# Patient Record
Sex: Female | Born: 1995 | Hispanic: Yes | Marital: Single | Smoking: Never smoker
Health system: Southern US, Community
[De-identification: ages and names within clinical notes are randomized; demographics above are authoritative.]

## PROBLEM LIST (undated history)

## (undated) ENCOUNTER — Inpatient Hospital Stay (HOSPITAL_COMMUNITY): Payer: Self-pay

## (undated) ENCOUNTER — Inpatient Hospital Stay: Payer: Self-pay

## (undated) DIAGNOSIS — B009 Herpesviral infection, unspecified: Secondary | ICD-10-CM

## (undated) DIAGNOSIS — Z789 Other specified health status: Secondary | ICD-10-CM

## (undated) HISTORY — PX: EXCISION VAGINAL CYST: SHX5825

## (undated) HISTORY — DX: Other specified health status: Z78.9

---
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
## 2014-04-05 ENCOUNTER — Emergency Department: Payer: Self-pay | Admitting: Emergency Medicine

## 2014-10-29 ENCOUNTER — Encounter (HOSPITAL_COMMUNITY): Payer: Self-pay | Admitting: *Deleted

## 2014-10-29 ENCOUNTER — Emergency Department (HOSPITAL_COMMUNITY)
Admission: EM | Admit: 2014-10-29 | Discharge: 2014-10-30 | Disposition: A | Discharge status: Home / Self Care | Payer: Self-pay | Attending: Emergency Medicine | Admitting: Emergency Medicine

## 2014-10-29 ENCOUNTER — Emergency Department (HOSPITAL_COMMUNITY): Payer: Self-pay

## 2014-10-29 DIAGNOSIS — Z3202 Encounter for pregnancy test, result negative: Secondary | ICD-10-CM | POA: Insufficient documentation

## 2014-10-29 DIAGNOSIS — N898 Other specified noninflammatory disorders of vagina: Secondary | ICD-10-CM | POA: Insufficient documentation

## 2014-10-29 DIAGNOSIS — R110 Nausea: Secondary | ICD-10-CM | POA: Insufficient documentation

## 2014-10-29 DIAGNOSIS — R1032 Left lower quadrant pain: Secondary | ICD-10-CM | POA: Insufficient documentation

## 2014-10-29 DIAGNOSIS — R102 Pelvic and perineal pain: Secondary | ICD-10-CM | POA: Insufficient documentation

## 2014-10-29 DIAGNOSIS — R7989 Other specified abnormal findings of blood chemistry: Secondary | ICD-10-CM | POA: Insufficient documentation

## 2014-10-29 LAB — URINALYSIS, ROUTINE W REFLEX MICROSCOPIC
BILIRUBIN URINE: NEGATIVE
Glucose, UA: NEGATIVE mg/dL
HGB URINE DIPSTICK: NEGATIVE
KETONES UR: NEGATIVE mg/dL
LEUKOCYTES UA: NEGATIVE
Nitrite: NEGATIVE
PH: 6 (ref 5.0–8.0)
Protein, ur: NEGATIVE mg/dL
Specific Gravity, Urine: 1.022 (ref 1.005–1.030)
Urobilinogen, UA: 0.2 mg/dL (ref 0.0–1.0)

## 2014-10-29 LAB — POC URINE PREG, ED: Preg Test, Ur: NEGATIVE

## 2014-10-29 NOTE — ED Notes (Signed)
?   Std exposure. Have some abd. Pain with intercourse.

## 2014-10-29 NOTE — ED Provider Notes (Signed)
CSN: [id number]     Arrival date & time 10/29/14  1847 History  This chart was scribed for non-physician practitioner working with Derwood KaplanAnkit Nanavati, MD by Elveria Risingimelie Horne, ED Scribe. This patient was seen in room B19C/B19C and the patient's care was started at 9:54 PM.   Chief Complaint  Patient presents with  . Exposure to STD   The history is provided by the patient. No language interpreter was used.   HPI Comments: [name] Richards is a 18 y.o. female with past medical history vaginal cyst presenting to the ED with vaginal discharge that has been ongoing for the past 4 days. Patient reports that the vaginal discharge has a clear discharge with a fishy odor. Stated that she's been experiencing vaginal itching. Reported that she has been experiencing vaginal discomfort worse with intercourse over the past couple of days. Stated that she started to experience left lower quadrant pain described as an intermittent burning sensation. Associated symptoms of nausea. Reported that she is not on any birth control. Reported that her last menstrual cycle was 10/01/2014. Stated that she is sexually active without using protection with her boyfriend. Denied dysuria, hematuria, vomiting, diarrhea, melena, hematochezia, neck pain, neck stiffness, back pain, fever, chills. [place] [facility]  History reviewed. No pertinent past medical history. Past Surgical History  Procedure Laterality Date  . Excision vaginal cyst     History reviewed. No pertinent family history. History  Substance Use Topics  . Smoking status: Never Smoker   . Smokeless tobacco: Not on file  . Alcohol Use: No   OB History    No data available     Review of Systems  Constitutional: Positive for chills. Negative for fever.  Respiratory: Negative for shortness of breath.   Cardiovascular: Negative for chest pain.  Gastrointestinal: Positive for abdominal pain.  Genitourinary: Positive for vaginal discharge, vaginal pain and  pelvic pain. Negative for dysuria, hematuria and vaginal bleeding.  Musculoskeletal: Negative for back pain, neck pain and neck stiffness.    Allergies  Review of patient's allergies indicates no known allergies.  Home Medications   Prior to Admission medications   Not on File   Triage Vitals: BP 124/71 mmHg  Pulse 116  Temp(Src) 98.3 F (36.8 C) (Oral)  Resp 14  Ht 5\' 1"  (1.549 m)  Wt 100 lb (45.36 kg)  BMI 18.90 kg/m2  SpO2 100%  LMP 10/01/2014   Physical Exam  Constitutional: She is oriented to person, place, and time. She appears well-developed and well-nourished. No distress.  HENT:  Head: Normocephalic and atraumatic.  Mouth/Throat: Oropharynx is clear and moist. No oropharyngeal exudate.  Eyes: Conjunctivae and EOM are normal. Pupils are equal, round, and reactive to light. Right eye exhibits no discharge. Left eye exhibits no discharge.  Neck: Normal range of motion. Neck supple. No tracheal deviation present.  Negative neck stiffness Negative nuchal rigidity Negative cervical lymphadenopathy Negative meningeal signs  Cardiovascular: Normal rate, regular rhythm and normal heart sounds.  Exam reveals no friction rub.   No murmur heard. Pulmonary/Chest: Effort normal and breath sounds normal. No respiratory distress. She has no wheezes. She has no rales.  Abdominal: Soft. Bowel sounds are normal. She exhibits no distension. There is tenderness. There is no rebound and no guarding.  Negative abdominal distention Bowel sounds normal active in all 4 quadrants Abdomen soft upon palpation Mild discomfort upon palpation to left lower quadrant Negative guarding Negative peritoneal signs   Genitourinary:  Pelvic exam: Negative swelling, erythema, inflammation,  lesions, sores, deformities identified the external genitalia and vaginal canal. Negative blood in the vaginal vault. Negative masses or polyps identified. Service identified negative friability. Thick white  discharge noted with negative odor. Negative CMT. Mild left adnexal tenderness noted upon palpation. Negative right adnexal tenderness. Exam chaperoned with tech  Musculoskeletal: Normal range of motion.  Lymphadenopathy:    She has no cervical adenopathy.  Neurological: She is alert and oriented to person, place, and time. No cranial nerve deficit. She exhibits normal muscle tone. Coordination normal.  Skin: Skin is warm and dry.  Psychiatric: She has a normal mood and affect. Her behavior is normal.  Nursing note and vitals reviewed.   ED Course  Procedures (including critical care time)  COORDINATION OF CARE: 9:54 PM- Discussed treatment plan with patient at bedside and patient agreed to plan.   Results for orders placed or performed during the hospital encounter of 10/29/14  Wet prep, genital  Result Value Ref Range   Yeast Wet Prep HPF POC FEW (A) NONE SEEN   Trich, Wet Prep NONE SEEN NONE SEEN   Clue Cells Wet Prep HPF POC FEW (A) NONE SEEN   WBC, Wet Prep HPF POC NONE SEEN NONE SEEN  Urinalysis, Routine w reflex microscopic  Result Value Ref Range   Color, Urine YELLOW YELLOW   APPearance CLOUDY (A) CLEAR   Specific Gravity, Urine 1.022 1.005 - 1.030   pH 6.0 5.0 - 8.0   Glucose, UA NEGATIVE NEGATIVE mg/dL   Hgb urine dipstick NEGATIVE NEGATIVE   Bilirubin Urine NEGATIVE NEGATIVE   Ketones, ur NEGATIVE NEGATIVE mg/dL   Protein, ur NEGATIVE NEGATIVE mg/dL   Urobilinogen, UA 0.2 0.0 - 1.0 mg/dL   Nitrite NEGATIVE NEGATIVE   Leukocytes, UA NEGATIVE NEGATIVE  RPR  Result Value Ref Range   RPR NON REAC NON REAC  CBC  Result Value Ref Range   WBC 7.4 4.0 - 10.5 K/uL   RBC 4.17 3.87 - 5.11 MIL/uL   Hemoglobin 12.4 12.0 - 15.0 g/dL   HCT 25.3 66.4 - 40.3 %   MCV 89.0 78.0 - 100.0 fL   MCH 29.7 26.0 - 34.0 pg   MCHC 33.4 30.0 - 36.0 g/dL   RDW 47.4 25.9 - 56.3 %   Platelets 211 150 - 400 K/uL  Basic metabolic panel  Result Value Ref Range   Sodium 141 137 - 147  mEq/L   Potassium 3.8 3.7 - 5.3 mEq/L   Chloride 107 96 - 112 mEq/L   CO2 20 19 - 32 mEq/L   Glucose, Bld 99 70 - 99 mg/dL   BUN 7 6 - 23 mg/dL   Creatinine, Ser 8.75 0.50 - 1.10 mg/dL   Calcium 9.0 8.4 - 64.3 mg/dL   GFR calc non Af Amer >90 >90 mL/min   GFR calc Af Amer >90 >90 mL/min   Anion gap 14 5 - 15  Troponin I  Result Value Ref Range   Troponin I <0.30 <0.30 ng/mL  D-dimer, quantitative  Result Value Ref Range   D-Dimer, Quant 0.83 (H) 0.00 - 0.48 ug/mL-FEU  POC Urine Pregnancy, ED (do NOT order at [facility])  Result Value Ref Range   Preg Test, Ur NEGATIVE NEGATIVE    Labs Review Labs Reviewed  WET PREP, GENITAL - Abnormal; Notable for the following:    Yeast Wet Prep HPF POC FEW (*)    Clue Cells Wet Prep HPF POC FEW (*)    All other components within normal limits  URINALYSIS, ROUTINE W REFLEX MICROSCOPIC - Abnormal; Notable for the following:    APPearance CLOUDY (*)    All other components within normal limits  D-DIMER, QUANTITATIVE - Abnormal; Notable for the following:    D-Dimer, Quant 0.83 (*)    All other components within normal limits  GC/CHLAMYDIA PROBE AMP  RPR  CBC  BASIC METABOLIC PANEL  TROPONIN I  HIV ANTIBODY (ROUTINE TESTING)  HSV 1 ANTIBODY, IGG  HSV 2 ANTIBODY, IGG  POC URINE PREG, ED    Imaging Review US Transvaginal Non-ob  10/30/2014   CLINICAL DATA:  Left lower quadrant pain.  EXAM: TRANSABDOMINAL AND TRANSVAGINAL ULTRASOUND OF PELVIS  DOPPLER ULTRASOUND OF OVARIES  TECHNIQUE: Both transabdominal and transvaginal ultrasound examinations of the pelvis were performed. Transabdominal technique was performed for global imaging of the pelvis including uterus, ovaries, adnexal regions, and pelvic cul-de-sac.  It was necessary to proceed with endovaginal exam following the transabdominal exam to visualize the uterus and ovaries. Color and duplex Doppler ultrasound was utilized to evaluate blood flow to the ovaries.  COMPARISON:  None.  FINDINGS:  Uterus  Measurements: 7.1 x 3.2 x 4.8 cm, anteverted. No fibroids or other mass visualized.  Endometrium  Thickness: 11 mm.  No focal abnormality visualized.  Right ovary  Measurements: 3.9 x 1.4 x 2.4 cm. Normal appearance/no adnexal mass.  Left ovary  Measurements: 3.7 x 2 x 2.7 cm. Normal appearance/no adnexal mass.  Pulsed Doppler evaluation of both ovaries demonstrates normal low-resistance arterial and venous waveforms. Flow is demonstrated in both ovaries on color flow Doppler imaging.  Other findings  No free fluid.  IMPRESSION: Normal ultrasound appearance of the uterus and ovaries. No evidence of ovarian mass or torsion.   Electronically Signed   By: Burman Nieves M.D.   On: 10/30/2014 00:51   US Pelvis Complete  10/30/2014   CLINICAL DATA:  Left lower quadrant pain.  EXAM: TRANSABDOMINAL AND TRANSVAGINAL ULTRASOUND OF PELVIS  DOPPLER ULTRASOUND OF OVARIES  TECHNIQUE: Both transabdominal and transvaginal ultrasound examinations of the pelvis were performed. Transabdominal technique was performed for global imaging of the pelvis including uterus, ovaries, adnexal regions, and pelvic cul-de-sac.  It was necessary to proceed with endovaginal exam following the transabdominal exam to visualize the uterus and ovaries. Color and duplex Doppler ultrasound was utilized to evaluate blood flow to the ovaries.  COMPARISON:  None.  FINDINGS: Uterus  Measurements: 7.1 x 3.2 x 4.8 cm, anteverted. No fibroids or other mass visualized.  Endometrium  Thickness: 11 mm.  No focal abnormality visualized.  Right ovary  Measurements: 3.9 x 1.4 x 2.4 cm. Normal appearance/no adnexal mass.  Left ovary  Measurements: 3.7 x 2 x 2.7 cm. Normal appearance/no adnexal mass.  Pulsed Doppler evaluation of both ovaries demonstrates normal low-resistance arterial and venous waveforms. Flow is demonstrated in both ovaries on color flow Doppler imaging.  Other findings  No free fluid.  IMPRESSION: Normal ultrasound appearance of the  uterus and ovaries. No evidence of ovarian mass or torsion.   Electronically Signed   By: Burman Nieves M.D.   On: 10/30/2014 00:51   [place] Art/ven Flow Abd Pelv Doppler  10/30/2014   CLINICAL DATA:  Left lower quadrant pain.  EXAM: TRANSABDOMINAL AND TRANSVAGINAL ULTRASOUND OF PELVIS  DOPPLER ULTRASOUND OF OVARIES  TECHNIQUE: Both transabdominal and transvaginal ultrasound examinations of the pelvis were performed. Transabdominal technique was performed for global imaging of the pelvis including uterus, ovaries, adnexal regions, and pelvic cul-de-sac.  It was necessary  to proceed with endovaginal exam following the transabdominal exam to visualize the uterus and ovaries. Color and duplex Doppler ultrasound was utilized to evaluate blood flow to the ovaries.  COMPARISON:  None.  FINDINGS: Uterus  Measurements: 7.1 x 3.2 x 4.8 cm, anteverted. No fibroids or other mass visualized.  Endometrium  Thickness: 11 mm.  No focal abnormality visualized.  Right ovary  Measurements: 3.9 x 1.4 x 2.4 cm. Normal appearance/no adnexal mass.  Left ovary  Measurements: 3.7 x 2 x 2.7 cm. Normal appearance/no adnexal mass.  Pulsed Doppler evaluation of both ovaries demonstrates normal low-resistance arterial and venous waveforms. Flow is demonstrated in both ovaries on color flow Doppler imaging.  Other findings  No free fluid.  IMPRESSION: Normal ultrasound appearance of the uterus and ovaries. No evidence of ovarian mass or torsion.   Electronically Signed   By: Burman NievesWilliam  Stevens M.D.   On: 10/30/2014 00:51     EKG Interpretation None      1:36 AM This provider reassessed the patient. Discussed labs in great detail and imaging in great detail. Patient reported that she's been starting to experience chest pain localized to center of her chest with associated shortness of breath, beginning just a couple of minutes ago. Patient reports that she has discomfort in the center of her chest with associated shortness of  breath-reported that she feels better after taking a deep breath in and out. Stated that she feels mildly anxious. Heart rate tachycardic upon auscultation. Negative wheezing. Negative stridor. Negative angioedema. Patient able to speak in full sentences without difficulty.   3:28 AM Patient re-assessed by this provider. Patient continues to be mildly tachycardic at 112 bpm. Reported that she is still experiencing some chest tightness/discomfort in the center of her chest with shortness of breath. Reported that her throat closing sensation has resolved. When in discussion, patient reported that she had episodes of shortness of breath and chest pain intermittently this past week. When asked why patient denied chest pain and shortness of breath at the initial interview, patient reported that she didn't think anything of it.  Heart rate tachycardic. Pulses palpable and strong. Patient continues to speak in full sentences without difficulty - negative stridor noted. Negative wheezing. Tenderness upon palpation to the chest wall - mid-sternal region.    MDM   Final diagnoses:  Pelvic pain in female  Elevated d-dimer    Medications  cefTRIAXone (ROCEPHIN) injection 250 mg (250 mg Intramuscular Given 10/30/14 0122)  azithromycin (ZITHROMAX) tablet 1,000 mg (1,000 mg Oral Given 10/30/14 0122)  lidocaine (PF) (XYLOCAINE) 1 % injection 2 mL (2 mLs Other Given 10/30/14 0122)  sodium chloride 0.9 % bolus 1,000 mL (0 mLs Intravenous Stopped 10/30/14 0315)  sodium chloride 0.9 % bolus 1,000 mL (1,000 mLs Intravenous New Bag/Given 10/30/14 0340)    Filed Vitals:   10/30/14 0141 10/30/14 0240 10/30/14 0254 10/30/14 0340  BP: 123/67   92/62  Pulse: 112 113 109 98  Temp: 98.1 F (36.7 C)     TempSrc: Oral     Resp: 16   13  Height:      Weight:      SpO2: 99%   100%   I personally performed the services described in this documentation, which was scribed in my presence. The recorded information has been  reviewed and is accurate.  CBC negative elevated white blood cell count. Hemoglobin 12.4, hematocrit 37.1. BMP unremarkable. Glucose 99, negative elevated anion-14.0 mEq per liter. Urine pregnancy negative. Urinalysis negative  for hemoglobin, nitrites, leukocytes. Wet prep identified few yeast and few clue cells-negative findings to Trichomonas or white blood cell count. GC chlamydia probe, RPR, HIV, HSV 1 and 2 pending. Pelvic ultrasound unremarkable-no evidence of ovarian torsion or mass. Doubt TOA. Doubt ovarian torsion. Doubt ectopic pregnancy. Doubt molar pregnancy. Doubt UTI. Doubt pyelonephritis. Patient treated prophylactically for possible STD infection since patient is sexually active and does not use protection.  While in the ED setting, patient reported that she started to experience chest pain substernal and shortness of breath. Tachycardic at 120 bpm. EKG, labs ordered. EKG noted sinus tachycardia with a heart rate 111 bpm. Troponin negative elevation. D-dimer elevated at 0.83. CT angiogram of the chest ordered. Discussed plan of care with Dr. Theodoro Grist. Transfer of care to Dr. Theodoro Grist at change in shift.   Raymon Mutton, PA-C 10/30/14 0424  Flint Melter, MD 11/02/14 [phone]

## 2014-10-30 ENCOUNTER — Emergency Department (HOSPITAL_COMMUNITY): Payer: Self-pay

## 2014-10-30 ENCOUNTER — Encounter (HOSPITAL_COMMUNITY): Payer: Self-pay

## 2014-10-30 LAB — CBC
HCT: 37.1 % (ref 36.0–46.0)
Hemoglobin: 12.4 g/dL (ref 12.0–15.0)
MCH: 29.7 pg (ref 26.0–34.0)
MCHC: 33.4 g/dL (ref 30.0–36.0)
MCV: 89 fL (ref 78.0–100.0)
Platelets: 211 10*3/uL (ref 150–400)
RBC: 4.17 MIL/uL (ref 3.87–5.11)
RDW: 13.2 % (ref 11.5–15.5)
WBC: 7.4 10*3/uL (ref 4.0–10.5)

## 2014-10-30 LAB — HSV 1 ANTIBODY, IGG: HSV 1 Glycoprotein G Ab, IgG: 11.49 IV — ABNORMAL HIGH

## 2014-10-30 LAB — BASIC METABOLIC PANEL
Anion gap: 14 (ref 5–15)
BUN: 7 mg/dL (ref 6–23)
CHLORIDE: 107 meq/L (ref 96–112)
CO2: 20 meq/L (ref 19–32)
CREATININE: 0.58 mg/dL (ref 0.50–1.10)
Calcium: 9 mg/dL (ref 8.4–10.5)
GFR calc Af Amer: >90 mL/min (ref >90)
GFR calc non Af Amer: >90 mL/min (ref >90)
Glucose, Bld: 99 mg/dL (ref 70–99)
Potassium: 3.8 mEq/L (ref 3.7–5.3)
Sodium: 141 mEq/L (ref 137–147)

## 2014-10-30 LAB — RPR

## 2014-10-30 LAB — WET PREP, GENITAL
Trich, Wet Prep: NONE SEEN
WBC, Wet Prep HPF POC: NONE SEEN

## 2014-10-30 LAB — HSV 2 ANTIBODY, IGG: HSV 2 Glycoprotein G Ab, IgG: 1.56 IV — ABNORMAL HIGH

## 2014-10-30 LAB — D-DIMER, QUANTITATIVE: D-Dimer, Quant: 0.83 ug/mL-FEU — ABNORMAL HIGH (ref 0.00–0.48)

## 2014-10-30 LAB — HIV ANTIBODY (ROUTINE TESTING W REFLEX): HIV: NONREACTIVE

## 2014-10-30 LAB — TROPONIN I: Troponin I: <0.3 ng/mL (ref <0.30)

## 2014-10-30 MED ORDER — LIDOCAINE HCL (PF) 1 % IJ SOLN
2.0000 mL | Freq: Once | INTRAMUSCULAR | Status: AC
  Administered 2014-10-30: 2 mL
  Filled 2014-10-30: qty 5

## 2014-10-30 MED ORDER — CEFTRIAXONE SODIUM 250 MG IJ SOLR
250.0000 mg | Freq: Once | INTRAMUSCULAR | Status: AC
  Administered 2014-10-30: 250 mg via INTRAMUSCULAR
  Filled 2014-10-30: qty 250

## 2014-10-30 MED ORDER — SODIUM CHLORIDE 0.9 % IV BOLUS (SEPSIS)
1000.0000 mL | Freq: Once | INTRAVENOUS | Status: AC
  Administered 2014-10-30: 1000 mL via INTRAVENOUS

## 2014-10-30 MED ORDER — SODIUM CHLORIDE 0.9 % IV BOLUS (SEPSIS)
1000.0000 mL | Freq: Once | INTRAVENOUS | Status: AC
Start: 2014-10-30 — End: 2014-10-30
  Administered 2014-10-30: 1000 mL via INTRAVENOUS

## 2014-10-30 MED ORDER — AZITHROMYCIN 250 MG PO TABS
1000.0000 mg | ORAL_TABLET | Freq: Once | ORAL | Status: AC
  Administered 2014-10-30: 1000 mg via ORAL
  Filled 2014-10-30: qty 4

## 2014-10-30 MED ORDER — IOHEXOL 350 MG/ML SOLN
100.0000 mL | Freq: Once | INTRAVENOUS | Status: AC | PRN
  Administered 2014-10-30: 80 mL via INTRAVENOUS

## 2014-10-30 NOTE — Discharge Instructions (Signed)
Abdominal Pain, Women °Abdominal (stomach, pelvic, or belly) pain can be caused by many things. It is important to tell your doctor: °· The location of the pain. °· Does it come and go or is it present all the time? °· Are there things that start the pain (eating certain foods, exercise)? °· Are there other symptoms associated with the pain (fever, nausea, vomiting, diarrhea)? °All of this is helpful to know when trying to find the cause of the pain. °CAUSES  °· Stomach: virus or bacteria infection, or ulcer. °· Intestine: appendicitis (inflamed appendix), regional ileitis (Crohn's disease), ulcerative colitis (inflamed colon), irritable bowel syndrome, diverticulitis (inflamed diverticulum of the colon), or cancer of the stomach or intestine. °· Gallbladder disease or stones in the gallbladder. °· Kidney disease, kidney stones, or infection. °· Pancreas infection or cancer. °· Fibromyalgia (pain disorder). °· Diseases of the female organs: °¨ Uterus: fibroid (non-cancerous) tumors or infection. °¨ Fallopian tubes: infection or tubal pregnancy. °¨ Ovary: cysts or tumors. °¨ Pelvic adhesions (scar tissue). °¨ Endometriosis (uterus lining tissue growing in the pelvis and on the pelvic organs). °¨ Pelvic congestion syndrome (female organs filling up with blood just before the menstrual period). °¨ Pain with the menstrual period. °¨ Pain with ovulation (producing an egg). °¨ Pain with an IUD (intrauterine device, birth control) in the uterus. °¨ Cancer of the female organs. °· Functional pain (pain not caused by a disease, may improve without treatment). °· Psychological pain. °· Depression. °DIAGNOSIS  °Your doctor will decide the seriousness of your pain by doing an examination. °· Blood tests. °· X-rays. °· Ultrasound. °· CT scan (computed tomography, special type of X-ray). °· MRI (magnetic resonance imaging). °· Cultures, for infection. °· Barium enema (dye inserted in the large intestine, to better view it with  X-rays). °· Colonoscopy (looking in intestine with a lighted tube). °· Laparoscopy (minor surgery, looking in abdomen with a lighted tube). °· Major abdominal exploratory surgery (looking in abdomen with a large incision). °TREATMENT  °The treatment will depend on the cause of the pain.  °· Many cases can be observed and treated at home. °· Over-the-counter medicines recommended by your caregiver. °· Prescription medicine. °· Antibiotics, for infection. °· Birth control pills, for painful periods or for ovulation pain. °· Hormone treatment, for endometriosis. °· Nerve blocking injections. °· Physical therapy. °· Antidepressants. °· Counseling with a psychologist or psychiatrist. °· Minor or major surgery. °HOME CARE INSTRUCTIONS  °· Do not take laxatives, unless directed by your caregiver. °· Take over-the-counter pain medicine only if ordered by your caregiver. Do not take aspirin because it can cause an upset stomach or bleeding. °· Try a clear liquid diet (broth or water) as ordered by your caregiver. Slowly move to a bland diet, as tolerated, if the pain is related to the stomach or intestine. °· Have a thermometer and take your temperature several times a day, and record it. °· Bed rest and sleep, if it helps the pain. °· Avoid sexual intercourse, if it causes pain. °· Avoid stressful situations. °· Keep your follow-up appointments and tests, as your caregiver orders. °· If the pain does not go away with medicine or surgery, you may try: °¨ Acupuncture. °¨ Relaxation exercises (yoga, meditation). °¨ Group therapy. °¨ Counseling. °SEEK MEDICAL CARE IF:  °· You notice certain foods cause stomach pain. °· Your home care treatment is not helping your pain. °· You need stronger pain medicine. °· You want your IUD removed. °· You feel faint or   lightheaded. °· You develop nausea and vomiting. °· You develop a rash. °· You are having side effects or an allergy to your medicine. °SEEK IMMEDIATE MEDICAL CARE IF:  °· Your  pain does not go away or gets worse. °· You have a fever. °· Your pain is felt only in portions of the abdomen. The right side could possibly be appendicitis. The left lower portion of the abdomen could be colitis or diverticulitis. °· You are passing blood in your stools (bright red or black tarry stools, with or without vomiting). °· You have blood in your urine. °· You develop chills, with or without a fever. °· You pass out. °MAKE SURE YOU:  °· Understand these instructions. °· Will watch your condition. °· Will get help right away if you are not doing well or get worse. °Document Released: 10/08/2007 Document Revised: 04/27/2014 Document Reviewed: 10/28/2009 °Exit[employer]® Patient Information ©2015 [employer]. This information is not intended to replace advice given to you by your health care provider. Make sure you discuss any questions you have with your health care provider. ° °

## 2014-10-30 NOTE — ED Provider Notes (Signed)
  Physical Exam  BP 98/64 mmHg  Pulse 87  Temp(Src) 98.1 F (36.7 C) (Oral)  Resp 18  Ht 5\' 1"  (1.549 m)  Wt 100 lb (45.36 kg)  BMI 18.90 kg/m2  SpO2 99%  LMP 10/01/2014  Physical Exam  ED Course  Procedures  MDM  Pt came in for vaginal discharge. Signed out to me as she had neg GU workup, but then complained of some dyspnea and chest pain, and is tachycardic. Dimer was elevated. CT PE ordered, and is neg. I didn't see the pt primarily.    Derwood KaplanAnkit Shanavia Makela, MD 10/30/14 [phone]

## 2014-10-30 NOTE — ED Notes (Signed)
Patient taken to CT.

## 2014-10-30 NOTE — ED Notes (Signed)
Patient returned from CT

## 2014-10-31 LAB — GC/CHLAMYDIA PROBE AMP
CT Probe RNA: POSITIVE — AB
GC PROBE AMP APTIMA: NEGATIVE

## 2014-11-01 ENCOUNTER — Telehealth: Payer: Self-pay | Admitting: Emergency Medicine

## 2014-11-01 NOTE — Telephone Encounter (Signed)
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

## 2014-11-02 ENCOUNTER — Telehealth (HOSPITAL_COMMUNITY): Payer: Self-pay

## 2014-11-02 NOTE — ED Notes (Signed)
Chart has returned from EDP office. Attempted call x 1.Unable to reach by telephone. Letter sent to address on record. Pt needs treatment for herpes- Acyclovir 400 po QID x 5 days, ordered by Fayrene HelperBowie Tran

## 2014-11-09 ENCOUNTER — Telehealth (HOSPITAL_COMMUNITY): Payer: Self-pay

## 2014-11-09 NOTE — Telephone Encounter (Signed)
Pt called stating her boyfriend had gotten tx but she didn't.  Pt ID verified x 2.  Pt informed (+) for chlamydia -> txd appropriately (Axithromycin & Rocephin)and (+) for HSV 1 & HSV 2.  We attempted to call # disconnected and sent a letter 11/9.  Chart was reviewed by Fayrene HelperBowie Tran PA "Acyclovir 400 mg po QID x 5 days"  Rx called and left on VM @ [facility] [phone].

## 2014-11-10 ENCOUNTER — Telehealth (HOSPITAL_BASED_OUTPATIENT_CLINIC_OR_DEPARTMENT_OTHER): Payer: Self-pay | Admitting: Emergency Medicine

## 2014-12-25 NOTE — L&D Delivery Note (Signed)
Patient is 19 y.o. G1P0 [redacted]w[redacted]d admitted for PROM and progressed well with augmentation. 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 Last outbreak in 2015 and SSE did not show any active lesions on labia, introitus or vaginal mucosa. She progressed quickly to complete and infant delivered with ~5 contractions and good pushing effort. Particulate meconium present. There were variable decelerations with return to baseline between pushing but moderate variability throughout.  Infant had nuchal x1 that was delivered through. The infant was not stimulated and spontaneously cried.   Delivery Note At 2:59 PM a viable female was delivered via NSVD (Presentation:OA ).  APGAR: 8, 9; weight-pending  .   Placenta status: intact.  Cord: 3-vessel with the following complications:particulate meconium, terminal meconium .  Cord pH: n/a  Anesthesia:  Epidural Episiotomy:  none Lacerations:  none Suture Repair: n/a Est. Blood Loss (mL):  100  Mom to postpartum.  Baby to Couplet care / Skin to Skin. Isa Rankin [facility] 09/04/2015, 3:23 PM

## 2015-01-13 ENCOUNTER — Encounter (HOSPITAL_COMMUNITY): Payer: Self-pay | Admitting: *Deleted

## 2015-01-13 ENCOUNTER — Emergency Department (HOSPITAL_COMMUNITY): Payer: Self-pay

## 2015-01-13 ENCOUNTER — Emergency Department (HOSPITAL_COMMUNITY)
Admission: EM | Admit: 2015-01-13 | Discharge: 2015-01-14 | Disposition: A | Discharge status: Home / Self Care | Payer: Self-pay | Attending: Emergency Medicine | Admitting: Emergency Medicine

## 2015-01-13 DIAGNOSIS — Z3A01 Less than 8 weeks gestation of pregnancy: Secondary | ICD-10-CM | POA: Insufficient documentation

## 2015-01-13 DIAGNOSIS — R102 Pelvic and perineal pain: Secondary | ICD-10-CM | POA: Insufficient documentation

## 2015-01-13 DIAGNOSIS — O9989 Other specified diseases and conditions complicating pregnancy, childbirth and the puerperium: Secondary | ICD-10-CM | POA: Insufficient documentation

## 2015-01-13 LAB — BASIC METABOLIC PANEL
Anion gap: 7 (ref 5–15)
BUN: 12 mg/dL (ref 6–23)
CALCIUM: 8.9 mg/dL (ref 8.4–10.5)
CO2: 26 mmol/L (ref 19–32)
Chloride: 104 mEq/L (ref 96–112)
Creatinine, Ser: 0.57 mg/dL (ref 0.50–1.10)
GFR calc Af Amer: >90 mL/min (ref >90)
GFR calc non Af Amer: >90 mL/min (ref >90)
GLUCOSE: 96 mg/dL (ref 70–99)
Potassium: 3.2 mmol/L — ABNORMAL LOW (ref 3.5–5.1)
SODIUM: 137 mmol/L (ref 135–145)

## 2015-01-13 LAB — URINALYSIS, ROUTINE W REFLEX MICROSCOPIC
BILIRUBIN URINE: NEGATIVE
GLUCOSE, UA: NEGATIVE mg/dL
HGB URINE DIPSTICK: NEGATIVE
Ketones, ur: NEGATIVE mg/dL
LEUKOCYTES UA: NEGATIVE
Nitrite: NEGATIVE
PROTEIN: NEGATIVE mg/dL
Specific Gravity, Urine: 1.02 (ref 1.005–1.030)
Urobilinogen, UA: 0.2 mg/dL (ref 0.0–1.0)
pH: 6 (ref 5.0–8.0)

## 2015-01-13 LAB — CBC WITH DIFFERENTIAL/PLATELET
BASOS ABS: 0 10*3/uL (ref 0.0–0.1)
Basophils Relative: 0 % (ref 0–1)
EOS PCT: 4 % (ref 0–5)
Eosinophils Absolute: 0.4 10*3/uL (ref 0.0–0.7)
HEMATOCRIT: 36.6 % (ref 36.0–46.0)
Hemoglobin: 12.7 g/dL (ref 12.0–15.0)
LYMPHS ABS: 2.3 10*3/uL (ref 0.7–4.0)
LYMPHS PCT: 27 % (ref 12–46)
MCH: 30.7 pg (ref 26.0–34.0)
MCHC: 34.7 g/dL (ref 30.0–36.0)
MCV: 88.4 fL (ref 78.0–100.0)
MONO ABS: 0.6 10*3/uL (ref 0.1–1.0)
MONOS PCT: 8 % (ref 3–12)
NEUTROS ABS: 5.1 10*3/uL (ref 1.7–7.7)
Neutrophils Relative %: 61 % (ref 43–77)
Platelets: 210 10*3/uL (ref 150–400)
RBC: 4.14 MIL/uL (ref 3.87–5.11)
RDW: 12.2 % (ref 11.5–15.5)
WBC: 8.4 10*3/uL (ref 4.0–10.5)

## 2015-01-13 LAB — PREGNANCY, URINE: PREG TEST UR: POSITIVE — AB

## 2015-01-13 LAB — WET PREP, GENITAL
Trich, Wet Prep: NONE SEEN
WBC WET PREP: NONE SEEN

## 2015-01-13 LAB — HCG, QUANTITATIVE, PREGNANCY: HCG, BETA CHAIN, QUANT, S: 1856 m[IU]/mL — AB (ref <5)

## 2015-01-13 LAB — OB RESULTS CONSOLE GC/CHLAMYDIA: Gonorrhea: NEGATIVE

## 2015-01-13 MED ORDER — MORPHINE SULFATE 4 MG/ML IJ SOLN
4.0000 mg | Freq: Once | INTRAMUSCULAR | Status: AC
  Administered 2015-01-13: 4 mg via INTRAVENOUS
  Filled 2015-01-13: qty 1

## 2015-01-13 MED ORDER — SODIUM CHLORIDE 0.9 % IV BOLUS (SEPSIS)
1000.0000 mL | Freq: Once | INTRAVENOUS | Status: AC
  Administered 2015-01-13: 1000 mL via INTRAVENOUS

## 2015-01-13 MED ORDER — DIPHENHYDRAMINE HCL 25 MG PO CAPS
25.0000 mg | ORAL_CAPSULE | Freq: Once | ORAL | Status: AC
  Administered 2015-01-13: 25 mg via ORAL
  Filled 2015-01-13: qty 1

## 2015-01-13 NOTE — ED Notes (Signed)
Pt c/o redness/burning up left arm after IV morphine. Pt VSS, NAD, NS infusing, MD made aware and given PO benadryl.

## 2015-01-13 NOTE — ED Notes (Signed)
POC Pregnancy test is POSITIVE but it is showing NEG. in epic  RN Eileen StanfordJenna informed

## 2015-01-13 NOTE — ED Notes (Signed)
The pt is c/o mid-abd pain fir one hour no n v or diarrhea.  lmpo dec she reports that she is preg.  No vaginal  bleeding

## 2015-01-13 NOTE — ED Provider Notes (Signed)
Complains of right lower abdominal pain onset 2 weeks ago, waxes and wanes. No fever no anorexia no other associated symptoms Last normal menstrual period December 2015. Had home pregnancy test which was positive. On exam patient is alert no distress abdomen nondistended normoactive bowel sounds minimally tender at right lower quadrant no guarding rigidity or rebound  Doug SouSam Mirakle Tomlin, MD 01/13/15 2327

## 2015-01-13 NOTE — ED Notes (Signed)
Ambulated w/ steady gait to restroom.

## 2015-01-13 NOTE — ED Provider Notes (Signed)
CSN: [id number]     Arrival date & time 01/13/15  2119 History   First MD Initiated Contact with Patient 01/13/15 2127     Chief Complaint  Patient presents with  . Abdominal Pain     (Consider location/radiation/quality/duration/timing/severity/associated sxs/prior Treatment) Patient is a 19 y.o. female presenting with abdominal pain. The history is provided by the patient. No language interpreter was used.  Abdominal Pain Pain location:  RLQ, LLQ and suprapubic Pain quality: cramping   Pain radiates to:  Does not radiate Pain severity:  Severe Onset quality:  Gradual Duration:  2 weeks Timing:  Intermittent Progression:  Waxing and waning Chronicity:  New Relieved by:  Nothing Worsened by:  Nothing tried Ineffective treatments:  None tried Associated symptoms: no anorexia, no constipation, no cough, no diarrhea, no dysuria, no fever, no nausea, no shortness of breath, no vaginal bleeding, no vaginal discharge and no vomiting   Risk factors: pregnancy   Risk factors: no alcohol abuse and has not had multiple surgeries     History reviewed. No pertinent past medical history. Past Surgical History  Procedure Laterality Date  . Excision vaginal cyst     No family history on file. History  Substance Use Topics  . Smoking status: Never Smoker   . Smokeless tobacco: Not on file  . Alcohol Use: No   OB History    No data available     Review of Systems  Constitutional: Negative for fever.  Respiratory: Negative for cough and shortness of breath.   Gastrointestinal: Positive for abdominal pain. Negative for nausea, vomiting, diarrhea, constipation and anorexia.  Genitourinary: Positive for pelvic pain. Negative for dysuria, urgency, vaginal bleeding, vaginal discharge and vaginal pain.  All other systems reviewed and are negative.     Allergies  Review of patient's allergies indicates no known allergies.  Home Medications   Prior to Admission medications    Medication Sig Start Date End Date Taking? Authorizing Provider  DM-Doxylamine-Acetaminophen (NYQUIL COLD & FLU PO) Take 30 mLs by mouth daily as needed (for cold).   Yes Historical Provider, MD   BP 109/76 mmHg  Pulse 94  Temp(Src) 97.3 F (36.3 C) (Oral)  Resp 13  Ht  (1.549 m)  Wt 105 lb (47.628 kg)  BMI 19.85 kg/m2  SpO2 100%  LMP 12/13/2014 Physical Exam  Constitutional: She is oriented to person, place, and time. She appears well-developed and well-nourished. No distress.  HENT:  Head: Normocephalic and atraumatic.  Eyes: Pupils are equal, round, and reactive to light.  Neck: Normal range of motion.  Cardiovascular: Normal rate, regular rhythm, normal heart sounds and intact distal pulses.   Pulmonary/Chest: Effort normal. No respiratory distress. She has no wheezes. She exhibits no tenderness.  Abdominal: Soft. Bowel sounds are normal. She exhibits no distension. There is tenderness in the right lower quadrant. There is no rebound and no guarding. Hernia confirmed negative in the right inguinal area and confirmed negative in the left inguinal area.  Genitourinary: There is no rash, tenderness, lesion or injury on the right labia. There is no rash, tenderness, lesion or injury on the left labia. Uterus is tender. Uterus is not enlarged. Cervix exhibits no motion tenderness and no discharge. Right adnexum displays tenderness. Right adnexum displays no mass and no fullness. Left adnexum displays tenderness. Left adnexum displays no mass and no fullness.  Neurological: She is alert and oriented to person, place, and time. She has normal strength. No cranial nerve deficit or sensory deficit.  She exhibits normal muscle tone. Coordination and gait normal.  Skin: Skin is warm and dry.  Nursing note and vitals reviewed.   ED Course  Procedures (including critical care time) Labs Review Labs Reviewed  WET PREP, GENITAL - Abnormal; Notable for the following:    Yeast Wet Prep HPF  POC FEW (*)    Clue Cells Wet Prep HPF POC MANY (*)    All other components within normal limits  BASIC METABOLIC PANEL - Abnormal; Notable for the following:    Potassium 3.2 (*)    All other components within normal limits  HCG, QUANTITATIVE, PREGNANCY - Abnormal; Notable for the following:    hCG, Beta Chain, Quant, S 1856 (*)    All other components within normal limits  PREGNANCY, URINE - Abnormal; Notable for the following:    Preg Test, Ur POSITIVE (*)    All other components within normal limits  POC URINE PREG, ED - Abnormal; Notable for the following:    Preg Test, Ur POSITIVE (*)    All other components within normal limits  CBC WITH DIFFERENTIAL  URINALYSIS, ROUTINE W REFLEX MICROSCOPIC  GC/CHLAMYDIA PROBE AMP ([place])    Imaging Review US Ob Comp Less 14 Wks  01/14/2015   CLINICAL DATA:  Abdominal pain and first trimester pregnancy  EXAM: OBSTETRIC <14 WK [place] AND TRANSVAGINAL OB US  TECHNIQUE: Both transabdominal and transvaginal ultrasound examinations were performed for complete evaluation of the gestation as well as the maternal uterus, adnexal regions, and pelvic cul-de-sac. Transvaginal technique was performed to assess early pregnancy.  COMPARISON:  Pelvic ultrasound 10/30/2014  FINDINGS: Intrauterine gestational sac: Visualized/normal in shape.  Yolk sac:  Present  Embryo:  Not yet visible  MSD:  3.9  mm   5 w   1  d  Maternal uterus/adnexae: Small volume free, simple pelvic fluid. No adnexal mass. No subchronic hemorrhage.  IMPRESSION: 1. Intrauterine gestational sac with yolk sac, estimated age 78 weeks 1 day. 2. Small volume free pelvic fluid.   Electronically Signed   By: Tiburcio Pea M.D.   On: 01/14/2015 01:53   US Ob Transvaginal  01/14/2015   CLINICAL DATA:  Abdominal pain and first trimester pregnancy  EXAM: OBSTETRIC <14 WK [place] AND TRANSVAGINAL OB US  TECHNIQUE: Both transabdominal and transvaginal ultrasound examinations were performed for complete  evaluation of the gestation as well as the maternal uterus, adnexal regions, and pelvic cul-de-sac. Transvaginal technique was performed to assess early pregnancy.  COMPARISON:  Pelvic ultrasound 10/30/2014  FINDINGS: Intrauterine gestational sac: Visualized/normal in shape.  Yolk sac:  Present  Embryo:  Not yet visible  MSD:  3.9  mm   5 w   1  d  Maternal uterus/adnexae: Small volume free, simple pelvic fluid. No adnexal mass. No subchronic hemorrhage.  IMPRESSION: 1. Intrauterine gestational sac with yolk sac, estimated age 78 weeks 1 day. 2. Small volume free pelvic fluid.   Electronically Signed   By: Tiburcio Pea M.D.   On: 01/14/2015 01:53     EKG Interpretation None      MDM   Final diagnoses:  Pelvic pain in female    19 y/o female with positive pregnancy here with RLQ abdominal pain in her first trimester.  Physical exam as above.  With pain to bilateral adnexa, no anorexia, no nausea, or vomiting, doubt appendicitis.  Initial concern is for pelvic pathology including ectopic pregnancy and ovarian torsion.  Initial work-up included urine pregnancy, UA, HCG, wet prep,  CBC, BMP, and OB transvaginal ultrasound.  With no pain above the umbilicus doubt acute cholecystitis or pancreatitis.  These demonstrated a positive pregnancy test, BMP with slight hypokalemia of 3.3, unremarkable CBC, and negative urine.  The patient was treated with 4 mg of morphine with significant improvement in her pain.  Her care was transferred to Dr. Ranae PalmsYelverton at midnight.  At that time we were awaiting the results of patient's vaginal ultrasound.  Please see their notes for continued details of care.  Labs reviewed by myself and considered in medical decision making.  Care was discussed with my attending Dr. Ethelda ChickJacubowitz.      Bethann BerkshireAaron Pradeep Beaubrun, MD 01/14/15 1315  Doug SouSam Jacubowitz, MD 01/14/15 1524

## 2015-01-14 LAB — GC/CHLAMYDIA PROBE AMP (~~LOC~~) NOT AT ARMC
CHLAMYDIA, DNA PROBE: NEGATIVE
Neisseria Gonorrhea: NEGATIVE

## 2015-01-14 LAB — POC URINE PREG, ED: PREG TEST UR: POSITIVE — AB

## 2015-01-14 NOTE — Discharge Instructions (Signed)
Dolor abdominal en el embarazo °(Abdominal Pain During Pregnancy) °El dolor abdominal es frecuente durante el embarazo. Generalmente no causa ningún daño. El dolor abdominal puede tener numerosas causas. Algunas causas son más graves que otras. Ciertas causas de dolor abdominal durante el embarazo se diagnostican fácilmente. A veces, se tarda un tiempo para llegar al diagnóstico. Otras veces la causa no se conoce. El dolor abdominal puede estar relacionado con alguna alteración del embarazo, o puede deberse a una causa totalmente diferente. Por este motivo, siempre consulte a su médico cuando sienta molestias abdominales. °INSTRUCCIONES PARA EL CUIDADO EN EL HOGAR  °Esté atenta al dolor para ver si hay cambios. Las siguientes indicaciones ayudarán a aliviar cualquier molestia que pueda sentir: °· No tenga relaciones sexuales y no coloque nada dentro de la vagina hasta que los síntomas hayan desaparecido completamente. °· Descanse todo lo que pueda hasta que el dolor se le haya calmado. °· Si siente náuseas, beba líquidos claros. Evite los alimentos sólidos mientras sienta malestar o tenga náuseas. °· Tome sólo medicamentos de venta libre o recetados, según las indicaciones del médico. °· Cumpla con todas las visitas de control, según le indique su médico. °SOLICITE ATENCIÓN MÉDICA DE INMEDIATO SI: °· Tiene un sangrado, pérdida de líquidos o elimina tejidos por la vagina. °· El dolor o los cólicos aumentan. °· Tiene vómitos persistentes. °· Comienza a sentir dolor al orinar u observa sangre. °· Tiene fiebre. °· Nota que los movimientos del bebé disminuyen. °· Siente intensa debilidad o se marea. °· Tiene dificultad para respirar con o sin dolor abdominal. °· Siente un dolor de cabeza intenso junto al dolor abdominal. °· Tiene una secreción vaginal anormal con dolor abdominal. °· Tiene diarrea persistente. °· El dolor abdominal sigue o empeora aún después de hacer reposo. °ASEGÚRESE DE QUE:  °· Comprende estas  instrucciones. °· Controlará su afección. °· Recibirá ayuda de inmediato si no mejora o si empeora. °Document Released: 12/11/2005 Document Revised: 10/01/2013 °[employer]® Patient Information ©2015 [employer]. This information is not intended to replace advice given to you by your health care provider. Make sure you discuss any questions you have with your health care provider. ° °

## 2015-01-14 NOTE — ED Provider Notes (Signed)
Note. Urine point of care pregnancy test positive  Doug SouSam Chelly Dombeck, MD 01/14/15 [id number]

## 2015-01-14 NOTE — ED Provider Notes (Signed)
Patient's pain is improved. Abdomen is soft. No rebound or guarding. US with IUP at 5 weeks. Low suspicion for surgical emergency. Advised to take Tylenol for pain. Return precautions given.  Loren Raceravid Danaye Sobh, MD 01/14/15 [phone]

## 2015-01-27 ENCOUNTER — Inpatient Hospital Stay (HOSPITAL_COMMUNITY)
Admission: AD | Admit: 2015-01-27 | Discharge: 2015-01-27 | Disposition: A | Discharge status: Home / Self Care | Payer: Self-pay | Source: Ambulatory Visit | Attending: Obstetrics & Gynecology | Admitting: Obstetrics & Gynecology

## 2015-01-27 ENCOUNTER — Other Ambulatory Visit: Payer: Self-pay | Admitting: *Deleted

## 2015-01-27 ENCOUNTER — Encounter (HOSPITAL_COMMUNITY): Payer: Self-pay | Admitting: *Deleted

## 2015-01-27 DIAGNOSIS — O210 Mild hyperemesis gravidarum: Secondary | ICD-10-CM | POA: Insufficient documentation

## 2015-01-27 DIAGNOSIS — O219 Vomiting of pregnancy, unspecified: Secondary | ICD-10-CM

## 2015-01-27 DIAGNOSIS — O26899 Other specified pregnancy related conditions, unspecified trimester: Secondary | ICD-10-CM

## 2015-01-27 DIAGNOSIS — O9989 Other specified diseases and conditions complicating pregnancy, childbirth and the puerperium: Secondary | ICD-10-CM | POA: Insufficient documentation

## 2015-01-27 DIAGNOSIS — R05 Cough: Secondary | ICD-10-CM | POA: Insufficient documentation

## 2015-01-27 DIAGNOSIS — R109 Unspecified abdominal pain: Secondary | ICD-10-CM | POA: Insufficient documentation

## 2015-01-27 DIAGNOSIS — Z3A01 Less than 8 weeks gestation of pregnancy: Secondary | ICD-10-CM | POA: Insufficient documentation

## 2015-01-27 DIAGNOSIS — O3680X1 Pregnancy with inconclusive fetal viability, fetus 1: Secondary | ICD-10-CM

## 2015-01-27 LAB — URINE MICROSCOPIC-ADD ON

## 2015-01-27 LAB — URINALYSIS, ROUTINE W REFLEX MICROSCOPIC
Bilirubin Urine: NEGATIVE
Glucose, UA: NEGATIVE mg/dL
HGB URINE DIPSTICK: NEGATIVE
Ketones, ur: NEGATIVE mg/dL
NITRITE: NEGATIVE
PROTEIN: NEGATIVE mg/dL
Specific Gravity, Urine: >1.03 — ABNORMAL HIGH (ref 1.005–1.030)
UROBILINOGEN UA: 0.2 mg/dL (ref 0.0–1.0)
pH: 6 (ref 5.0–8.0)

## 2015-01-27 LAB — WET PREP, GENITAL
Clue Cells Wet Prep HPF POC: NONE SEEN
TRICH WET PREP: NONE SEEN
Yeast Wet Prep HPF POC: NONE SEEN

## 2015-01-27 NOTE — MAU Provider Note (Signed)
History     CSN: [id number]  Arrival date and time: 01/27/15 [id number]   First Provider Initiated Contact with Patient 01/27/15 1058      Chief Complaint  Patient presents with  . Cough  . Abdominal Pain   Cough Associated symptoms include shortness of breath. Pertinent negatives include no chest pain, chills, fever, headaches, heartburn or sore throat.  Abdominal Pain Associated symptoms include nausea and vomiting. Pertinent negatives include no constipation, diarrhea, dysuria, fever or headaches.     [name] Richards is a 19 y.o. G1P0 at 5166w0d presenting with a chief complaint of cough and abdominal pain. Patient reports the cough has been present for 2 months, it is non-productive and worse at night. She has tried vapor rub, but has not tried any other medications or remedies. Pt reports a sharp centralized suprapubic abdominal pain x 3 weeks that she rates at 6/10. Pt reports decreased appetite x 3 weeks, nausea, and one episode of vomiting this morning. Denies diarrhea, constipation, dysuria, hematemesis, or blood in stools. US confirmed intrauterine pregnancy on 01/14/15. Denies vaginal bleeding, but reports white vaginal discharge x 1 month.   OB History    Gravida Para Term Preterm AB TAB SAB Ectopic Multiple Living   1               History reviewed. No pertinent past medical history.  Past Surgical History  Procedure Laterality Date  . Excision vaginal cyst      History reviewed. No pertinent family history.  History  Substance Use Topics  . Smoking status: Never Smoker   . Smokeless tobacco: Not on file  . Alcohol Use: No    Allergies: No Known Allergies  No prescriptions prior to admission   Results for orders placed or performed during the hospital encounter of 01/27/15 (from the past 48 hour(s))  Urinalysis, Routine w reflex microscopic     Status: Abnormal   Collection Time: 01/27/15 10:00 AM  Result Value Ref Range   Color, Urine YELLOW YELLOW   APPearance  CLEAR CLEAR   Specific Gravity, Urine >1.030 (H) 1.005 - 1.030   pH 6.0 5.0 - 8.0   Glucose, UA NEGATIVE NEGATIVE mg/dL   Hgb urine dipstick NEGATIVE NEGATIVE   Bilirubin Urine NEGATIVE NEGATIVE   Ketones, ur NEGATIVE NEGATIVE mg/dL   Protein, ur NEGATIVE NEGATIVE mg/dL   Urobilinogen, UA 0.2 0.0 - 1.0 mg/dL   Nitrite NEGATIVE NEGATIVE   Leukocytes, UA SMALL (A) NEGATIVE  Urine microscopic-add on     Status: Abnormal   Collection Time: 01/27/15 10:00 AM  Result Value Ref Range   Squamous Epithelial / LPF FEW (A) RARE   WBC, UA 0-2 <3 WBC/hpf   Bacteria, UA FEW (A) RARE   Urine-Other MUCOUS PRESENT   Wet prep, genital     Status: Abnormal   Collection Time: 01/27/15 12:00 PM  Result Value Ref Range   Yeast Wet Prep HPF POC NONE SEEN NONE SEEN   Trich, Wet Prep NONE SEEN NONE SEEN   Clue Cells Wet Prep HPF POC NONE SEEN NONE SEEN   WBC, Wet Prep HPF POC FEW (A) NONE SEEN    Comment: FEW BACTERIA SEEN    Review of Systems  Constitutional: Negative for fever and chills.  HENT: Negative for sore throat.   Respiratory: Positive for cough and shortness of breath. Negative for sputum production.   Cardiovascular: Negative for chest pain and palpitations.  Gastrointestinal: Positive for nausea, vomiting and abdominal pain. Negative for  heartburn, diarrhea, constipation and blood in stool.  Genitourinary: Negative for dysuria.       Denies vaginal bleeding   Neurological: Negative for headaches.   Physical Exam   Blood pressure 131/77, pulse 89, temperature 98.1 F (36.7 C), temperature source Oral, resp. rate 16, height 5' (1.524 m), weight 51.256 kg (113 lb), last menstrual period 12/01/2014, SpO2 100 %.  Physical Exam  Constitutional: She is oriented to person, place, and time. She appears well-developed and well-nourished. No distress.  HENT:  Head: Normocephalic.  Eyes: Conjunctivae and EOM are normal.  Cardiovascular: Normal rate, regular rhythm and normal heart sounds.   Exam reveals no gallop and no friction rub.   No murmur heard. Respiratory: Effort normal and breath sounds normal. No respiratory distress. She has no wheezes. She has no rales.  GI: Soft. Bowel sounds are normal. She exhibits no distension. There is tenderness (to palpation in LUQ ). There is no rebound and no guarding.  Neurological: She is alert and oriented to person, place, and time.  Skin: Skin is warm and dry.  Psychiatric: She has a normal mood and affect.    MAU Course  Procedures  None  MDM UA- negative Wet prep on 1/20 showed many clue and + yeast. Patient was not treated.  US showed IUP with Yolk sac on 1/20 Wet prep collected in MAU by RN   Assessment and Plan  A-  1. Abdominal pain in pregnancy   2. Nausea/vomiting in pregnancy     P-Discharge home in stable condition     Provide resources for prenatal care     Provide list of OTC medications she can take for cough and nausea      Start prenatal care ASAP     Return to MAU as needed, if symptoms worsen       Tia Masker PA- Student 01/27/2015, 10:59 AM   Evaluation and management procedures were performed by the PA student under my supervision and collaboration. I have reviewed the note and chart, and I agree with the management and plan.  Iona Hansen Pavan Bring, NP 01/27/2015 3:34 PM

## 2015-01-27 NOTE — MAU Note (Signed)
Has a dry cough, not going away.  Hard to breath at times, esp at night.  Has been having pains in lower stomach.  Sometimes feels sick.

## 2015-01-27 NOTE — Discharge Instructions (Signed)

## 2015-01-28 LAB — CULTURE, OB URINE: Special Requests: NORMAL

## 2015-02-02 ENCOUNTER — Other Ambulatory Visit: Payer: Self-pay | Admitting: Family Medicine

## 2015-02-02 DIAGNOSIS — O3680X1 Pregnancy with inconclusive fetal viability, fetus 1: Secondary | ICD-10-CM

## 2015-02-03 ENCOUNTER — Ambulatory Visit (HOSPITAL_COMMUNITY)
Admission: RE | Admit: 2015-02-03 | Discharge: 2015-02-03 | Disposition: A | Discharge status: Home / Self Care | Payer: Self-pay | Source: Ambulatory Visit | Attending: Family Medicine | Admitting: Family Medicine

## 2015-02-03 DIAGNOSIS — O3680X0 Pregnancy with inconclusive fetal viability, not applicable or unspecified: Secondary | ICD-10-CM | POA: Insufficient documentation

## 2015-02-03 DIAGNOSIS — O3680X1 Pregnancy with inconclusive fetal viability, fetus 1: Secondary | ICD-10-CM

## 2015-02-05 ENCOUNTER — Ambulatory Visit (HOSPITAL_COMMUNITY): Payer: Self-pay

## 2015-02-22 ENCOUNTER — Encounter: Payer: Self-pay | Admitting: Obstetrics and Gynecology

## 2015-02-27 ENCOUNTER — Emergency Department (HOSPITAL_COMMUNITY): Payer: Self-pay

## 2015-02-27 ENCOUNTER — Encounter (HOSPITAL_COMMUNITY): Payer: Self-pay | Admitting: Emergency Medicine

## 2015-02-27 ENCOUNTER — Emergency Department (HOSPITAL_COMMUNITY)
Admission: EM | Admit: 2015-02-27 | Discharge: 2015-02-27 | Disposition: A | Discharge status: Home / Self Care | Payer: Self-pay | Attending: Emergency Medicine | Admitting: Emergency Medicine

## 2015-02-27 DIAGNOSIS — O99611 Diseases of the digestive system complicating pregnancy, first trimester: Secondary | ICD-10-CM | POA: Insufficient documentation

## 2015-02-27 DIAGNOSIS — Z3A00 Weeks of gestation of pregnancy not specified: Secondary | ICD-10-CM | POA: Insufficient documentation

## 2015-02-27 DIAGNOSIS — O26899 Other specified pregnancy related conditions, unspecified trimester: Secondary | ICD-10-CM

## 2015-02-27 DIAGNOSIS — R109 Unspecified abdominal pain: Secondary | ICD-10-CM

## 2015-02-27 DIAGNOSIS — R1030 Lower abdominal pain, unspecified: Secondary | ICD-10-CM | POA: Insufficient documentation

## 2015-02-27 DIAGNOSIS — R102 Pelvic and perineal pain: Secondary | ICD-10-CM

## 2015-02-27 LAB — CBC WITH DIFFERENTIAL/PLATELET
BASOS ABS: 0 10*3/uL (ref 0.0–0.1)
BASOS PCT: 0 % (ref 0–1)
Eosinophils Absolute: 0.2 10*3/uL (ref 0.0–0.7)
Eosinophils Relative: 2 % (ref 0–5)
HCT: 37.2 % (ref 36.0–46.0)
Hemoglobin: 13 g/dL (ref 12.0–15.0)
LYMPHS PCT: 13 % (ref 12–46)
Lymphs Abs: 1.3 10*3/uL (ref 0.7–4.0)
MCH: 31.8 pg (ref 26.0–34.0)
MCHC: 34.9 g/dL (ref 30.0–36.0)
MCV: 91 fL (ref 78.0–100.0)
Monocytes Absolute: 0.6 10*3/uL (ref 0.1–1.0)
Monocytes Relative: 6 % (ref 3–12)
NEUTROS PCT: 79 % — AB (ref 43–77)
Neutro Abs: 7.5 10*3/uL (ref 1.7–7.7)
Platelets: 171 10*3/uL (ref 150–400)
RBC: 4.09 MIL/uL (ref 3.87–5.11)
RDW: 12.4 % (ref 11.5–15.5)
WBC: 9.6 10*3/uL (ref 4.0–10.5)

## 2015-02-27 LAB — URINALYSIS, ROUTINE W REFLEX MICROSCOPIC
Bilirubin Urine: NEGATIVE
Glucose, UA: NEGATIVE mg/dL
Hgb urine dipstick: NEGATIVE
Ketones, ur: 40 mg/dL — AB
NITRITE: NEGATIVE
PH: 6 (ref 5.0–8.0)
PROTEIN: NEGATIVE mg/dL
Specific Gravity, Urine: 1.022 (ref 1.005–1.030)
Urobilinogen, UA: 0.2 mg/dL (ref 0.0–1.0)

## 2015-02-27 LAB — COMPREHENSIVE METABOLIC PANEL
ALBUMIN: 3.9 g/dL (ref 3.5–5.2)
ALT: 12 U/L (ref 0–35)
AST: 18 U/L (ref 0–37)
Alkaline Phosphatase: 58 U/L (ref 39–117)
Anion gap: 10 (ref 5–15)
BUN: 7 mg/dL (ref 6–23)
CALCIUM: 8.9 mg/dL (ref 8.4–10.5)
CHLORIDE: 104 mmol/L (ref 96–112)
CO2: 20 mmol/L (ref 19–32)
CREATININE: 0.47 mg/dL — AB (ref 0.50–1.10)
GFR calc Af Amer: >90 mL/min (ref >90)
GLUCOSE: 95 mg/dL (ref 70–99)
Potassium: 3.3 mmol/L — ABNORMAL LOW (ref 3.5–5.1)
SODIUM: 134 mmol/L — AB (ref 135–145)
Total Bilirubin: 0.3 mg/dL (ref 0.3–1.2)
Total Protein: 7.1 g/dL (ref 6.0–8.3)

## 2015-02-27 LAB — URINE MICROSCOPIC-ADD ON

## 2015-02-27 LAB — POC URINE PREG, ED: Preg Test, Ur: POSITIVE — AB

## 2015-02-27 MED ORDER — MORPHINE SULFATE 4 MG/ML IJ SOLN
4.0000 mg | Freq: Once | INTRAMUSCULAR | Status: AC
  Administered 2015-02-27: 4 mg via INTRAVENOUS
  Filled 2015-02-27: qty 1

## 2015-02-27 NOTE — ED Provider Notes (Signed)
CSN: [id number]     Arrival date & time 02/27/15  0005 History  This chart was scribed for [name] Batonourtney F Katlynn Naser, MD by [name] Richards, ED Scribe. This patient was seen in room A08C/A08C and the patient's care was started at 12:52 AM.    Chief Complaint  Patient presents with  . Abdominal Pain    The history is provided by the patient. No language interpreter was used.     HPI Comments: [name] Richards is a 19 y.o. female G1PO who presents to the Emergency Department complaining of sudden onset, sharp, 8/10, left sided abdominal pain onset 30 minutes ago. Patient is approximately 3 months pregnant (US completed last visit 5269w4d) and notes the pain radiates diffusely over the abdomen. There is associated dysuria for the past 3 weeks, however, she has not been evaluated for this. She also notes white vaginal discharge. She reports history of lower abdominal pain since becoming pregnant, but thought this was normal for pregnancy. She denies vaginal bleeding with this episode of abdominal pain. She further denies fever, chills, nausea, vomiting, diarrhea.    History reviewed. No pertinent past medical history. Past Surgical History  Procedure Laterality Date  . Excision vaginal cyst     No family history on file. History  Substance Use Topics  . Smoking status: Never Smoker   . Smokeless tobacco: Not on file  . Alcohol Use: No   OB History    Gravida Para Term Preterm AB TAB SAB Ectopic Multiple Living   1              Review of Systems  Constitutional: Negative for fever.  Respiratory: Negative for chest tightness and shortness of breath.   Cardiovascular: Negative for chest pain.  Gastrointestinal: Positive for abdominal pain. Negative for nausea, vomiting, constipation and abdominal distention.  Genitourinary: Positive for vaginal discharge. Negative for dysuria, vaginal bleeding and vaginal pain.  Musculoskeletal: Negative for back pain.  Neurological: Negative for headaches.  All  other systems reviewed and are negative.     Allergies  Review of patient's allergies indicates no known allergies.  Home Medications   Prior to Admission medications   Not on File   Triage Vitals: BP 107/72 mmHg  Pulse 107  Temp(Src) 98.4 F (36.9 C) (Oral)  Resp 20  Ht 5\' 2"  (1.575 m)  SpO2 99%  LMP 12/01/2014  Physical Exam  Constitutional: She is oriented to person, place, and time. She appears well-developed and well-nourished. No distress.  HENT:  Head: Normocephalic and atraumatic.  Eyes: Pupils are equal, round, and reactive to light.  Cardiovascular: Normal rate, regular rhythm and normal heart sounds.   No murmur heard. Pulmonary/Chest: Effort normal and breath sounds normal. No respiratory distress. She has no wheezes.  Abdominal: Soft. Bowel sounds are normal.  Mild diffuse tenderness to palpation of the lower abdomen  Genitourinary:  Normal external vaginal exam, cervical os closed, moderate amount of vaginal discharge noted, no blood  Neurological: She is alert and oriented to person, place, and time.  Skin: Skin is warm and dry.  Psychiatric: She has a normal mood and affect.  Nursing note and vitals reviewed.   ED Course  Procedures (including critical care time)  DIAGNOSTIC STUDIES: Oxygen Saturation is 99% on room air, normal by my interpretation.    COORDINATION OF CARE: At [id number] Discussed treatment plan with patient which includes US. Patient agrees.   Labs Review Labs Reviewed  CBC WITH DIFFERENTIAL/PLATELET - Abnormal; Notable for the following:  Neutrophils Relative % 79 (*)    All other components within normal limits  COMPREHENSIVE METABOLIC PANEL - Abnormal; Notable for the following:    Sodium 134 (*)    Potassium 3.3 (*)    Creatinine, Ser 0.47 (*)    All other components within normal limits  URINALYSIS, ROUTINE W REFLEX MICROSCOPIC - Abnormal; Notable for the following:    Ketones, ur 40 (*)    Leukocytes, UA SMALL (*)     All other components within normal limits  POC URINE PREG, ED - Abnormal; Notable for the following:    Preg Test, Ur POSITIVE (*)    All other components within normal limits  URINE CULTURE  URINE MICROSCOPIC-ADD ON    Imaging Review US Ob Comp Less 14 Wks  02/27/2015   CLINICAL DATA:  Acute onset of pelvic pain.  Initial encounter.  EXAM: OBSTETRIC <14 WK [place]  US DOPPLER ULTRASOUND OF OVARIES  TECHNIQUE: Transabdominal ultrasound examinations were performed for complete evaluation of the gestation as well as the maternal uterus, adnexal regions, and pelvic cul-de-sac.  Color and duplex Doppler ultrasound was utilized to evaluate blood flow to the ovaries.  COMPARISON:  None.  FINDINGS: Intrauterine gestational sac: Visualized/normal in shape.  Yolk sac:  No  Embryo:  Yes  Cardiac Activity: Yes  Heart Rate: 160 bpm  CRL:   50.7  mm   11 w 6 d                  [place] EDC: 09/12/2015  Maternal uterus/adnexae: No subchorionic hemorrhage is noted. The uterus otherwise unremarkable in appearance.  The ovaries are within normal limits. The right ovary measures 2.6 x 1.6 x 1.9 cm, while the left ovary measures 2.8 x 1.5 x 2.1 cm. No suspicious adnexal masses are seen; there is no evidence for ovarian torsion.  No free fluid is seen within the pelvic cul-de-sac.  Pulsed Doppler evaluation of both ovaries demonstrates normal appearing low-resistance arterial and venous waveforms.  IMPRESSION: 1. Single live intrauterine pregnancy noted, with a crown-rump length of 5.1 cm, corresponding to a gestational age of [redacted] weeks 6 days. This matches the gestational age of [redacted] weeks 4 days by LMP, reflecting an estimated date of delivery of September 07, 2015. 2. No evidence for ovarian torsion. Normal arterial and venous waveforms seen with regard to both ovaries.   Electronically Signed   By: Roanna Raider M.D.   On: 02/27/2015 04:51   [place] Art/ven Flow Abd Pelv Doppler  02/27/2015   CLINICAL DATA:  Acute onset of pelvic pain.   Initial encounter.  EXAM: OBSTETRIC <14 WK [place]  US DOPPLER ULTRASOUND OF OVARIES  TECHNIQUE: Transabdominal ultrasound examinations were performed for complete evaluation of the gestation as well as the maternal uterus, adnexal regions, and pelvic cul-de-sac.  Color and duplex Doppler ultrasound was utilized to evaluate blood flow to the ovaries.  COMPARISON:  None.  FINDINGS: Intrauterine gestational sac: Visualized/normal in shape.  Yolk sac:  No  Embryo:  Yes  Cardiac Activity: Yes  Heart Rate: 160 bpm  CRL:   50.7  mm   11 w 6 d                  [place] EDC: 09/12/2015  Maternal uterus/adnexae: No subchorionic hemorrhage is noted. The uterus otherwise unremarkable in appearance.  The ovaries are within normal limits. The right ovary measures 2.6 x 1.6 x 1.9 cm, while the left ovary measures 2.8 x 1.5  x 2.1 cm. No suspicious adnexal masses are seen; there is no evidence for ovarian torsion.  No free fluid is seen within the pelvic cul-de-sac.  Pulsed Doppler evaluation of both ovaries demonstrates normal appearing low-resistance arterial and venous waveforms.  IMPRESSION: 1. Single live intrauterine pregnancy noted, with a crown-rump length of 5.1 cm, corresponding to a gestational age of [redacted] weeks 6 days. This matches the gestational age of [redacted] weeks 4 days by LMP, reflecting an estimated date of delivery of September 07, 2015. 2. No evidence for ovarian torsion. Normal arterial and venous waveforms seen with regard to both ovaries.   Electronically Signed   By: Roanna Raider M.D.   On: 02/27/2015 04:51     EKG Interpretation None      MDM   Final diagnoses:  Abdominal pain  Abdominal pain during pregnancy    patient presents with abdominal pain during pregnancy.  She has a confirmed intrauterine pregnancy. She reports abrupt onset of left lower quadrant abdominal pain. Would be considered for torsion. Patient sent for ultrasound which is negative for poor vision and continues to show a viable  intrauterine pregnancy.  Cervical os is closed and patient is not having any abdominal bleeding. Patient was given pain medication and on repeat exam reports complete resolution of symptoms. Patient has follow-up with her OB on Wednesday. She is to follow-up as scheduled.  After history, exam, and medical workup I feel the patient has been appropriately medically screened and is safe for discharge home. Pertinent diagnoses were discussed with the patient. Patient was given return precautions.  I personally performed the services described in this documentation, which was scribed in my presence. The recorded information has been reviewed and is accurate.   [name] Baton, MD 02/27/15 [phone]

## 2015-02-27 NOTE — Discharge Instructions (Signed)

## 2015-02-27 NOTE — ED Notes (Signed)
Pt ambulated to restroom with steady gait.

## 2015-02-27 NOTE — ED Notes (Signed)
Pt reports severe L sided abdominal pain that started suddenly 30 mins ago. Pt is approx 3 months pregnant. US completed last visit. Pregnancy in is the uterus. Pt denies nvd.

## 2015-02-28 LAB — URINE CULTURE
COLONY COUNT: NO GROWTH
CULTURE: NO GROWTH

## 2015-03-03 ENCOUNTER — Ambulatory Visit (INDEPENDENT_AMBULATORY_CARE_PROVIDER_SITE_OTHER): Payer: Self-pay | Admitting: Family

## 2015-03-03 ENCOUNTER — Other Ambulatory Visit: Payer: Self-pay | Admitting: Family

## 2015-03-03 ENCOUNTER — Encounter: Payer: Self-pay | Admitting: Family

## 2015-03-03 VITALS — BP 107/66 | HR 96 | Wt 112.2 lb

## 2015-03-03 DIAGNOSIS — O98319 Other infections with a predominantly sexual mode of transmission complicating pregnancy, unspecified trimester: Secondary | ICD-10-CM

## 2015-03-03 DIAGNOSIS — Z3481 Encounter for supervision of other normal pregnancy, first trimester: Secondary | ICD-10-CM

## 2015-03-03 DIAGNOSIS — O98519 Other viral diseases complicating pregnancy, unspecified trimester: Secondary | ICD-10-CM

## 2015-03-03 DIAGNOSIS — Z3682 Encounter for antenatal screening for nuchal translucency: Secondary | ICD-10-CM

## 2015-03-03 DIAGNOSIS — A609 Anogenital herpesviral infection, unspecified: Secondary | ICD-10-CM

## 2015-03-03 DIAGNOSIS — Z3491 Encounter for supervision of normal pregnancy, unspecified, first trimester: Secondary | ICD-10-CM

## 2015-03-03 DIAGNOSIS — Z3401 Encounter for supervision of normal first pregnancy, first trimester: Secondary | ICD-10-CM

## 2015-03-03 DIAGNOSIS — Z23 Encounter for immunization: Secondary | ICD-10-CM

## 2015-03-03 DIAGNOSIS — Z3493 Encounter for supervision of normal pregnancy, unspecified, third trimester: Secondary | ICD-10-CM | POA: Insufficient documentation

## 2015-03-03 DIAGNOSIS — A6009 Herpesviral infection of other urogenital tract: Secondary | ICD-10-CM

## 2015-03-03 LAB — POCT URINALYSIS DIP (DEVICE)
BILIRUBIN URINE: NEGATIVE
GLUCOSE, UA: NEGATIVE mg/dL
Hgb urine dipstick: NEGATIVE
Ketones, ur: NEGATIVE mg/dL
NITRITE: NEGATIVE
PROTEIN: NEGATIVE mg/dL
Specific Gravity, Urine: >=1.03 (ref 1.005–1.030)
Urobilinogen, UA: 0.2 mg/dL (ref 0.0–1.0)
pH: 5.5 (ref 5.0–8.0)

## 2015-03-03 MED ORDER — PRENATAL VITAMINS 0.8 MG PO TABS: 1.0000 | ORAL_TABLET | Freq: Every day | ORAL | Status: DC

## 2015-03-03 MED ORDER — NITROFURANTOIN MONOHYD MACRO 100 MG PO CAPS: 100.0000 mg | ORAL_CAPSULE | Freq: Two times a day (BID) | ORAL | Status: DC

## 2015-03-03 NOTE — Progress Notes (Signed)
   Subjective:    [name] Richards is a G1P0 5970w0d being seen today for her first obstetrical visit.  Her obstetrical history is significant for genital herpes diagnosed in November 2015. Patient does intend to breast feed. Pregnancy history fully reviewed.  Patient reports no bleeding and no cramping.  Filed Vitals:   03/03/15 0903  BP: 107/66  Pulse: 96  Weight: 112 lb 3.2 oz (50.894 kg)    HISTORY: OB History  Gravida Para Term Preterm AB SAB TAB Ectopic Multiple Living  1             # Outcome Date GA Lbr Len/2nd Weight Sex Delivery Anes PTL Lv  1 Current              Past Medical History  Diagnosis Date  . Medical history non-contributory    Past Surgical History  Procedure Laterality Date  . Excision vaginal cyst     Family History  Problem Relation Age of Onset  . Asthma Father   . Diabetes Maternal Grandmother   . Diabetes Maternal Grandfather      Exam   BP 107/66 mmHg  Pulse 96  Wt 112 lb 3.2 oz (50.894 kg)  LMP 12/01/2014 Uterine Size: 13 cm  Pelvic Exam:   System: Breast:  No nipple retraction or dimpling, No nipple discharge or bleeding, No axillary or supraclavicular adenopathy, Normal to palpation without dominant masses   Skin: normal coloration and turgor, no rashes    Neurologic: negative   Extremities: normal strength, tone, and muscle mass   HEENT neck supple with midline trachea and thyroid without masses   Mouth/Teeth mucous membranes moist, pharynx normal without lesions   Neck supple and no masses   Cardiovascular: regular rate and rhythm, no murmurs or gallops   Respiratory:  appears well, vitals normal, no respiratory distress, acyanotic, normal RR, neck free of mass or lymphadenopathy, chest clear, no wheezing, crepitations, rhonchi, normal symmetric air entry   Abdomen: soft, non-tender; bowel sounds normal; no masses,  no organomegaly   Urinary: urethral meatus normal     Assessment:    Pregnancy:  [age] yo G1P0 at 7370w0d wks  IUP Urinary Tract Infection Patient Active Problem List   Diagnosis Date Noted  . Supervision of normal pregnancy in first trimester 03/03/2015  . Genital herpes affecting pregnancy 03/03/2015        Plan:     Initial labs drawn. Prenatal vitamins. Problem list reviewed and updated. RX Macrobid 100 mg bid x 7 days; urine culture sent. Genetic Screening discussed First Screen: ordered.  Follow up in 4 weeks.  Marlis EdelsonKARIM, Cashe Gatt N 03/03/2015

## 2015-03-03 NOTE — Patient Instructions (Signed)
First Trimester of Pregnancy The first trimester of pregnancy is from week 1 until the end of week 12 (months 1 through 3). A week after a sperm fertilizes an egg, the egg will implant on the wall of the uterus. This embryo will begin to develop into a baby. Genes from you and your partner are forming the baby. The female genes determine whether the baby is a boy or a girl. At 6-8 weeks, the eyes and face are formed, and the heartbeat can be seen on ultrasound. At the end of 12 weeks, all the baby's organs are formed.  Now that you are pregnant, you will want to do everything you can to have a healthy baby. Two of the most important things are to get good prenatal care and to follow your health care provider's instructions. Prenatal care is all the medical care you receive before the baby's birth. This care will help prevent, find, and treat any problems during the pregnancy and childbirth. BODY CHANGES Your body goes through many changes during pregnancy. The changes vary from woman to woman.   You may gain or lose a couple of pounds at first.  You may feel sick to your stomach (nauseous) and throw up (vomit). If the vomiting is uncontrollable, call your health care provider.  You may tire easily.  You may develop headaches that can be relieved by medicines approved by your health care provider.  You may urinate more often. Painful urination may mean you have a bladder infection.  You may develop heartburn as a result of your pregnancy.  You may develop constipation because certain hormones are causing the muscles that push waste through your intestines to slow down.  You may develop hemorrhoids or swollen, bulging veins (varicose veins).  Your breasts may begin to grow larger and become tender. Your nipples may stick out more, and the tissue that surrounds them (areola) may become darker.  Your gums may bleed and may be sensitive to brushing and flossing.  Dark spots or blotches (chloasma,  mask of pregnancy) may develop on your face. This will likely fade after the baby is born.  Your menstrual periods will stop.  You may have a loss of appetite.  You may develop cravings for certain kinds of food.  You may have changes in your emotions from day to day, such as being excited to be pregnant or being concerned that something may go wrong with the pregnancy and baby.  You may have more vivid and strange dreams.  You may have changes in your hair. These can include thickening of your hair, rapid growth, and changes in texture. Some women also have hair loss during or after pregnancy, or hair that feels dry or thin. Your hair will most likely return to normal after your baby is born. WHAT TO EXPECT AT YOUR PRENATAL VISITS During a routine prenatal visit:  You will be weighed to make sure you and the baby are growing normally.  Your blood pressure will be taken.  Your abdomen will be measured to track your baby's growth.  The fetal heartbeat will be listened to starting around week 10 or 12 of your pregnancy.  Test results from any previous visits will be discussed. Your health care provider may ask you:  How you are feeling.  If you are feeling the baby move.  If you have had any abnormal symptoms, such as leaking fluid, bleeding, severe headaches, or abdominal cramping.  If you have any questions. Other tests   that may be performed during your first trimester include:  Blood tests to find your blood type and to check for the presence of any previous infections. They will also be used to check for low iron levels (anemia) and Rh antibodies. Later in the pregnancy, blood tests for diabetes will be done along with other tests if problems develop.  Urine tests to check for infections, diabetes, or protein in the urine.  An ultrasound to confirm the proper growth and development of the baby.  An amniocentesis to check for possible genetic problems.  Fetal screens for  spina bifida and Down syndrome.  You may need other tests to make sure you and the baby are doing well. HOME CARE INSTRUCTIONS  Medicines  Follow your health care provider's instructions regarding medicine use. Specific medicines may be either safe or unsafe to take during pregnancy.  Take your prenatal vitamins as directed.  If you develop constipation, try taking a stool softener if your health care provider approves. Diet  Eat regular, well-balanced meals. Choose a variety of foods, such as meat or vegetable-based protein, fish, milk and low-fat dairy products, vegetables, fruits, and whole grain breads and cereals. Your health care provider will help you determine the amount of weight gain that is right for you.  Avoid raw meat and uncooked cheese. These carry germs that can cause birth defects in the baby.  Eating four or five small meals rather than three large meals a day may help relieve nausea and vomiting. If you start to feel nauseous, eating a few soda crackers can be helpful. Drinking liquids between meals instead of during meals also seems to help nausea and vomiting.  If you develop constipation, eat more high-fiber foods, such as fresh vegetables or fruit and whole grains. Drink enough fluids to keep your urine clear or pale yellow. Activity and Exercise  Exercise only as directed by your health care provider. Exercising will help you:  Control your weight.  Stay in shape.  Be prepared for labor and delivery.  Experiencing pain or cramping in the lower abdomen or low back is a good sign that you should stop exercising. Check with your health care provider before continuing normal exercises.  Try to avoid standing for long periods of time. Move your legs often if you must stand in one place for a long time.  Avoid heavy lifting.  Wear low-heeled shoes, and practice good posture.  You may continue to have sex unless your health care provider directs you  otherwise. Relief of Pain or Discomfort  Wear a good support bra for breast tenderness.   Take warm sitz baths to soothe any pain or discomfort caused by hemorrhoids. Use hemorrhoid cream if your health care provider approves.   Rest with your legs elevated if you have leg cramps or low back pain.  If you develop varicose veins in your legs, wear support hose. Elevate your feet for 15 minutes, 3-4 times a day. Limit salt in your diet. Prenatal Care  Schedule your prenatal visits by the twelfth week of pregnancy. They are usually scheduled monthly at first, then more often in the last 2 months before delivery.  Write down your questions. Take them to your prenatal visits.  Keep all your prenatal visits as directed by your health care provider. Safety  Wear your seat belt at all times when driving.  Make a list of emergency phone numbers, including numbers for family, friends, the hospital, and police and fire departments. General Tips    Ask your health care provider for a referral to a local prenatal education class. Begin classes no later than at the beginning of month 6 of your pregnancy.  Ask for help if you have counseling or nutritional needs during pregnancy. Your health care provider can offer advice or refer you to specialists for help with various needs.  Do not use hot tubs, steam rooms, or saunas.  Do not douche or use tampons or scented sanitary pads.  Do not cross your legs for long periods of time.  Avoid cat litter boxes and soil used by cats. These carry germs that can cause birth defects in the baby and possibly loss of the fetus by miscarriage or stillbirth.  Avoid all smoking, herbs, alcohol, and medicines not prescribed by your health care provider. Chemicals in these affect the formation and growth of the baby.  Schedule a dentist appointment. At home, brush your teeth with a soft toothbrush and be gentle when you floss. SEEK MEDICAL CARE IF:   You have  dizziness.  You have mild pelvic cramps, pelvic pressure, or nagging pain in the abdominal area.  You have persistent nausea, vomiting, or diarrhea.  You have a bad smelling vaginal discharge.  You have pain with urination.  You notice increased swelling in your face, hands, legs, or ankles. SEEK IMMEDIATE MEDICAL CARE IF:   You have a fever.  You are leaking fluid from your vagina.  You have spotting or bleeding from your vagina.  You have severe abdominal cramping or pain.  You have rapid weight gain or loss.  You vomit blood or material that looks like coffee grounds.  You are exposed to German measles and have never had them.  You are exposed to fifth disease or chickenpox.  You develop a severe headache.  You have shortness of breath.  You have any kind of trauma, such as from a fall or a car accident. Document Released: 12/05/2001 Document Revised: 04/27/2014 Document Reviewed: 10/21/2013 [employer] Patient Information 2015 [employer]. This information is not intended to replace advice given to you by your health care provider. Make sure you discuss any questions you have with your health care provider.  

## 2015-03-04 LAB — PRENATAL PROFILE (SOLSTAS)
Antibody Screen: NEGATIVE
Basophils Absolute: 0 10*3/uL (ref 0.0–0.1)
Basophils Relative: 0 % (ref 0–1)
Eosinophils Absolute: 0.3 10*3/uL (ref 0.0–0.7)
Eosinophils Relative: 4 % (ref 0–5)
HCT: 37 % (ref 36.0–46.0)
HEMOGLOBIN: 12.4 g/dL (ref 12.0–15.0)
HIV: NONREACTIVE
Hepatitis B Surface Ag: NEGATIVE
LYMPHS ABS: 1.5 10*3/uL (ref 0.7–4.0)
Lymphocytes Relative: 20 % (ref 12–46)
MCH: 30.9 pg (ref 26.0–34.0)
MCHC: 33.5 g/dL (ref 30.0–36.0)
MCV: 92.3 fL (ref 78.0–100.0)
MONO ABS: 0.4 10*3/uL (ref 0.1–1.0)
MPV: 10.8 fL (ref 8.6–12.4)
Monocytes Relative: 6 % (ref 3–12)
Neutro Abs: 5.2 10*3/uL (ref 1.7–7.7)
Neutrophils Relative %: 70 % (ref 43–77)
Platelets: 201 10*3/uL (ref 150–400)
RBC: 4.01 MIL/uL (ref 3.87–5.11)
RDW: 13.4 % (ref 11.5–15.5)
Rh Type: POSITIVE
Rubella: 3.11 Index — ABNORMAL HIGH (ref <0.90)
WBC: 7.4 10*3/uL (ref 4.0–10.5)

## 2015-03-04 LAB — GC/CHLAMYDIA PROBE AMP
CT PROBE, AMP APTIMA: NEGATIVE
GC PROBE AMP APTIMA: NEGATIVE

## 2015-03-04 LAB — CULTURE, OB URINE

## 2015-03-05 LAB — PRESCRIPTION MONITORING PROFILE (19 PANEL)
Amphetamine/Meth: NEGATIVE ng/mL
BENZODIAZEPINE SCREEN, URINE: NEGATIVE ng/mL
Barbiturate Screen, Urine: NEGATIVE ng/mL
Buprenorphine, Urine: NEGATIVE ng/mL
Cannabinoid Scrn, Ur: NEGATIVE ng/mL
Carisoprodol, Urine: NEGATIVE ng/mL
Cocaine Metabolites: NEGATIVE ng/mL
Creatinine, Urine: 268.97 mg/dL (ref >20.0)
FENTANYL URINE: NEGATIVE ng/mL
MDMA URINE: NEGATIVE ng/mL
Meperidine, Ur: NEGATIVE ng/mL
Methadone Screen, Urine: NEGATIVE ng/mL
Methaqualone: NEGATIVE ng/mL
Nitrites, Initial: NEGATIVE ug/mL
OPIATE SCREEN, URINE: NEGATIVE ng/mL
Oxycodone Screen, Ur: NEGATIVE ng/mL
PH URINE, INITIAL: 5.8 pH (ref 4.5–8.9)
Phencyclidine, Ur: NEGATIVE ng/mL
Propoxyphene: NEGATIVE ng/mL
TAPENTADOLUR: NEGATIVE ng/mL
TRAMADOL UR: NEGATIVE ng/mL
ZOLPIDEM, URINE: NEGATIVE ng/mL

## 2015-03-10 ENCOUNTER — Ambulatory Visit (HOSPITAL_COMMUNITY): Payer: Self-pay

## 2015-03-10 ENCOUNTER — Ambulatory Visit (HOSPITAL_COMMUNITY): Payer: Self-pay | Attending: Emergency Medicine

## 2015-03-31 ENCOUNTER — Encounter: Payer: Self-pay | Admitting: Advanced Practice Midwife

## 2015-03-31 ENCOUNTER — Telehealth: Payer: Self-pay | Admitting: Advanced Practice Midwife

## 2015-03-31 NOTE — Telephone Encounter (Signed)
No number on file for patient, mailing letter to patient due to missing Ob follow up appointment.

## 2015-04-14 ENCOUNTER — Encounter: Payer: Self-pay | Admitting: General Practice

## 2015-05-06 ENCOUNTER — Observation Stay
Admission: EM | Admit: 2015-05-06 | Discharge: 2015-05-06 | Disposition: A | Discharge status: Home / Self Care | Payer: Self-pay | Attending: Obstetrics and Gynecology | Admitting: Obstetrics and Gynecology

## 2015-05-06 DIAGNOSIS — O26899 Other specified pregnancy related conditions, unspecified trimester: Secondary | ICD-10-CM

## 2015-05-06 DIAGNOSIS — Z3491 Encounter for supervision of normal pregnancy, unspecified, first trimester: Secondary | ICD-10-CM

## 2015-05-06 DIAGNOSIS — Z3A22 22 weeks gestation of pregnancy: Secondary | ICD-10-CM | POA: Insufficient documentation

## 2015-05-06 DIAGNOSIS — A6009 Herpesviral infection of other urogenital tract: Secondary | ICD-10-CM

## 2015-05-06 DIAGNOSIS — O2692 Pregnancy related conditions, unspecified, second trimester: Principal | ICD-10-CM | POA: Insufficient documentation

## 2015-05-06 DIAGNOSIS — R109 Unspecified abdominal pain: Secondary | ICD-10-CM | POA: Insufficient documentation

## 2015-05-06 DIAGNOSIS — O98319 Other infections with a predominantly sexual mode of transmission complicating pregnancy, unspecified trimester: Secondary | ICD-10-CM

## 2015-05-06 LAB — URINALYSIS COMPLETE WITH MICROSCOPIC (ARMC ONLY)
Bilirubin Urine: NEGATIVE
GLUCOSE, UA: 50 mg/dL — AB
Hgb urine dipstick: NEGATIVE
Ketones, ur: NEGATIVE mg/dL
LEUKOCYTES UA: NEGATIVE
NITRITE: NEGATIVE
PROTEIN: NEGATIVE mg/dL
SPECIFIC GRAVITY, URINE: 1.017 (ref 1.005–1.030)
pH: 7 (ref 5.0–8.0)

## 2015-05-06 NOTE — OB Triage Note (Signed)
Patient c/o of right sided abd pain x three days

## 2015-05-06 NOTE — Discharge Instructions (Signed)
Second Trimester of Pregnancy The second trimester is from week 13 through week 28, month 4 through 6. This is often the time in pregnancy that you feel your best. Often times, morning sickness has lessened or quit. You may have more energy, and you may get hungry more often. Your unborn baby (fetus) is growing rapidly. At the end of the sixth month, he or she is about 9 inches long and weighs about 1 pounds. You will likely feel the baby move (quickening) between 18 and 20 weeks of pregnancy. HOME CARE   Avoid all smoking, herbs, and alcohol. Avoid drugs not approved by your doctor.  Only take medicine as told by your doctor. Some medicines are safe and some are not during pregnancy.  Exercise only as told by your doctor. Stop exercising if you start having cramps.  Eat regular, healthy meals.  Wear a good support bra if your breasts are tender.  Do not use hot tubs, steam rooms, or saunas.  Wear your seat belt when driving.  Avoid raw meat, uncooked cheese, and liter boxes and soil used by cats.  Take your prenatal vitamins.  Try taking medicine that helps you poop (stool softener) as needed, and if your doctor approves. Eat more fiber by eating fresh fruit, vegetables, and whole grains. Drink enough fluids to keep your pee (urine) clear or pale yellow.  Take warm water baths (sitz baths) to soothe pain or discomfort caused by hemorrhoids. Use hemorrhoid cream if your doctor approves.  If you have puffy, bulging veins (varicose veins), wear support hose. Raise (elevate) your feet for 15 minutes, 3-4 times a day. Limit salt in your diet.  Avoid heavy lifting, wear low heals, and sit up straight.  Rest with your legs raised if you have leg cramps or low back pain.  Visit your dentist if you have not gone during your pregnancy. Use a soft toothbrush to brush your teeth. Be gentle when you floss.  You can have sex (intercourse) unless your doctor tells you not to.  Go to your  doctor visits. GET HELP IF:   You feel dizzy.  You have mild cramps or pressure in your lower belly (abdomen).  You have a nagging pain in your belly area.  You continue to feel sick to your stomach (nauseous), throw up (vomit), or have watery poop (diarrhea).  You have bad smelling fluid coming from your vagina.  You have pain with peeing (urination). GET HELP RIGHT AWAY IF:   You have a fever.  You are leaking fluid from your vagina.  You have spotting or bleeding from your vagina.  You have severe belly cramping or pain.  You lose or gain weight rapidly.  You have trouble catching your breath and have chest pain.  You notice sudden or extreme puffiness (swelling) of your face, hands, ankles, feet, or legs.  You have not felt the baby move in over an hour.  You have severe headaches that do not go away with medicine.  You have vision changes. Document Released: 03/07/2010 Document Revised: 04/07/2013 Document Reviewed: 02/11/2013 [facility] Patient Information 2015 [place]Exit[place], [place]. This information is not intended to replace advice given to you by your health care provider. Make sure you discuss any questions you have with your health care provider.  Abdominal Pain During Pregnancy Abdominal pain is common in pregnancy. Most of the time, it does not cause harm. There are many causes of abdominal pain. Some causes are more serious than others. Some of  the causes of abdominal pain in pregnancy are easily diagnosed. Occasionally, the diagnosis takes time to understand. Other times, the cause is not determined. Abdominal pain can be a sign that something is very wrong with the pregnancy, or the pain may have nothing to do with the pregnancy at all. For this reason, always tell your health care provider if you have any abdominal discomfort. HOME CARE INSTRUCTIONS  Monitor your abdominal pain for any changes. The following actions may help to alleviate any discomfort you are  experiencing:  Do not have sexual intercourse or put anything in your vagina until your symptoms go away completely.  Get plenty of rest until your pain improves.  Drink clear fluids if you feel nauseous. Avoid solid food as long as you are uncomfortable or nauseous.  Only take over-the-counter or prescription medicine as directed by your health care provider.  Keep all follow-up appointments with your health care provider. SEEK IMMEDIATE MEDICAL CARE IF:  You are bleeding, leaking fluid, or passing tissue from the vagina.  You have increasing pain or cramping.  You have persistent vomiting.  You have painful or bloody urination.  You have a fever.  You notice a decrease in your baby's movements.  You have extreme weakness or feel faint.  You have shortness of breath, with or without abdominal pain.  You develop a severe headache with abdominal pain.  You have abnormal vaginal discharge with abdominal pain.  You have persistent diarrhea.  You have abdominal pain that continues even after rest, or gets worse. MAKE SURE YOU:   Understand these instructions.  Will watch your condition.  Will get help right away if you are not doing well or get worse. Document Released: 12/11/2005 Document Revised: 10/01/2013 Document Reviewed: 07/10/2013 [facility]ExitCare Patient Information 2015 [place], [place]. This information is not intended to replace advice given to you by your health care provider. Make sure you discuss any questions you have with your health care provider.

## 2015-07-28 ENCOUNTER — Ambulatory Visit (INDEPENDENT_AMBULATORY_CARE_PROVIDER_SITE_OTHER): Payer: Self-pay | Admitting: Advanced Practice Midwife

## 2015-07-28 VITALS — BP 114/65 | HR 99 | Temp 98.5°F | Wt 142.9 lb

## 2015-07-28 DIAGNOSIS — L299 Pruritus, unspecified: Secondary | ICD-10-CM

## 2015-07-28 DIAGNOSIS — Z3493 Encounter for supervision of normal pregnancy, unspecified, third trimester: Secondary | ICD-10-CM

## 2015-07-28 DIAGNOSIS — Z23 Encounter for immunization: Secondary | ICD-10-CM

## 2015-07-28 LAB — POCT URINALYSIS DIP (DEVICE)
BILIRUBIN URINE: NEGATIVE
GLUCOSE, UA: NEGATIVE mg/dL
Hgb urine dipstick: NEGATIVE
KETONES UR: NEGATIVE mg/dL
Leukocytes, UA: NEGATIVE
NITRITE: NEGATIVE
Protein, ur: 30 mg/dL — AB
SPECIFIC GRAVITY, URINE: 1.025 (ref 1.005–1.030)
UROBILINOGEN UA: 0.2 mg/dL (ref 0.0–1.0)
pH: 7 (ref 5.0–8.0)

## 2015-07-28 MED ORDER — HYDROCORTISONE 0.5 % EX CREA: 1.0000 "application " | TOPICAL_CREAM | Freq: Two times a day (BID) | CUTANEOUS | Status: DC

## 2015-07-28 MED ORDER — HYDROXYZINE PAMOATE 25 MG PO CAPS: 25.0000 mg | ORAL_CAPSULE | Freq: Three times a day (TID) | ORAL | Status: DC | PRN

## 2015-07-28 MED ORDER — TETANUS-DIPHTH-ACELL PERTUSSIS 5-2.5-18.5 LF-MCG/0.5 IM SUSP
0.5000 mL | Freq: Once | INTRAMUSCULAR | Status: AC
Start: 2015-07-28 — End: 2015-07-28
  Administered 2015-07-28: 0.5 mL via INTRAMUSCULAR

## 2015-07-28 NOTE — Progress Notes (Signed)
28 wks labs with 1 hour gtt Breastfeeding tip of the week reviewed Pt complains of itching all over for the past week Tdap vaccine

## 2015-07-28 NOTE — Progress Notes (Signed)
Subjective:  [name] Richards is a 19 y.o. G1P0 at [redacted]w[redacted]d being seen today for ongoing prenatal care.  Patient reports generalized itching of arms, hands, abdomen, shoulders, face, legs, feet and rash on shoulders.  Contractions: Not present.  Vag. Bleeding: None. Movement: Present. Denies leaking of fluid.   The following portions of the patient's history were reviewed and updated as appropriate: allergies, current medications, past family history, past medical history, past social history, past surgical history and problem list.   Objective:   Filed Vitals:   07/28/15 0834  BP: 114/65  Pulse: 99  Temp: 98.5 F (36.9 C)  Weight: 142 lb 14.4 oz (64.819 kg)    Fetal Status: Fetal Heart Rate (bpm): 151 Fundal Height: 33 cm Movement: Present     General:  Alert, oriented and cooperative. Patient is in no acute distress.  Skin: Skin is warm and dry. No rash noted.   Cardiovascular: Normal heart rate noted  Respiratory: Normal respiratory effort, no problems with respiration noted  Abdomen: Soft, gravid, appropriate for gestational age. Pain/Pressure: Absent     Vaginal: Vag. Bleeding: None.       Cervix: Not evaluated        Extremities: Normal range of motion.  Edema: Trace  Mental Status: Normal mood and affect. Normal behavior. Normal judgment and thought content.   Urinalysis: Urine Protein: 1+ Urine Glucose: Negative  Assessment and Plan:  Pregnancy: G1P0 at [redacted]w[redacted]d  1. Prenatal care in third trimester  - Glucose Tolerance, 1 HR (50g) w/o Fasting - CBC - RPR - HIV antibody (with reflex)  2. Need for Tdap vaccination  - Tdap (BOOSTRIX) injection 0.5 mL; Inject 0.5 mLs into the muscle once.  3. Pruritus  - Bile acids, total -Vistaril 25 mg TID PRN  -Hydrocortisone 0.5 % topical BID PRN rash  Preterm labor symptoms and general obstetric precautions including but not limited to vaginal bleeding, contractions, leaking of fluid and fetal movement were reviewed in detail with the  patient. Please refer to After Visit Summary for other counseling recommendations.  Return in about 2 weeks (around 08/11/2015).   Hurshel Party, CNM

## 2015-07-29 LAB — CBC
HCT: 34.1 % — ABNORMAL LOW (ref 36.0–46.0)
Hemoglobin: 11.1 g/dL — ABNORMAL LOW (ref 12.0–15.0)
MCH: 28.5 pg (ref 26.0–34.0)
MCHC: 32.6 g/dL (ref 30.0–36.0)
MCV: 87.4 fL (ref 78.0–100.0)
MPV: 11.1 fL (ref 8.6–12.4)
PLATELETS: 233 10*3/uL (ref 150–400)
RBC: 3.9 MIL/uL (ref 3.87–5.11)
RDW: 15.2 % (ref 11.5–15.5)
WBC: 11.5 10*3/uL — AB (ref 4.0–10.5)

## 2015-07-29 LAB — GLUCOSE TOLERANCE, 1 HOUR (50G) W/O FASTING: GLUCOSE 1 HOUR GTT: 118 mg/dL (ref 70–140)

## 2015-07-29 LAB — HIV ANTIBODY (ROUTINE TESTING W REFLEX): HIV: NONREACTIVE

## 2015-07-29 LAB — RPR

## 2015-07-30 LAB — BILE ACIDS, TOTAL: BILE ACIDS TOTAL: 17 umol/L (ref 0–19)

## 2015-08-01 ENCOUNTER — Encounter (HOSPITAL_COMMUNITY): Payer: Self-pay | Admitting: *Deleted

## 2015-08-01 ENCOUNTER — Inpatient Hospital Stay (HOSPITAL_COMMUNITY)
Admission: AD | Admit: 2015-08-01 | Discharge: 2015-08-01 | Disposition: A | Discharge status: Home / Self Care | Payer: Self-pay | Source: Ambulatory Visit | Attending: Obstetrics and Gynecology | Admitting: Obstetrics and Gynecology

## 2015-08-01 DIAGNOSIS — O9989 Other specified diseases and conditions complicating pregnancy, childbirth and the puerperium: Secondary | ICD-10-CM

## 2015-08-01 DIAGNOSIS — Z3A33 33 weeks gestation of pregnancy: Secondary | ICD-10-CM

## 2015-08-01 DIAGNOSIS — N898 Other specified noninflammatory disorders of vagina: Secondary | ICD-10-CM

## 2015-08-01 LAB — WET PREP, GENITAL
CLUE CELLS WET PREP: NONE SEEN
Trich, Wet Prep: NONE SEEN
Yeast Wet Prep HPF POC: NONE SEEN

## 2015-08-01 MED ORDER — VALACYCLOVIR HCL 1 G PO TABS: 1000.0000 mg | ORAL_TABLET | Freq: Two times a day (BID) | ORAL | Status: AC

## 2015-08-01 NOTE — MAU Provider Note (Signed)
  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 For several days now.  CSN: [id number]  Arrival date and time: 08/01/15 1306   None     Chief Complaint  Patient presents with  . vaginal irritation    HPI  OB History    Gravida Para Term Preterm AB TAB SAB Ectopic Multiple Living   1               Past Medical History  Diagnosis Date  . Medical history non-contributory     Past Surgical History  Procedure Laterality Date  . Excision vaginal cyst      Family History  Problem Relation Age of Onset  . Asthma Father   . Diabetes Maternal Grandmother   . Diabetes Maternal Grandfather     History  Substance Use Topics  . Smoking status: Never Smoker   . Smokeless tobacco: Not on file  . Alcohol Use: No    Allergies: No Known Allergies  Prescriptions prior to admission  Medication Sig Dispense Refill Last Dose  . hydrOXYzine (VISTARIL) 25 MG capsule Take 1 capsule (25 mg total) by mouth 3 (three) times daily as needed. 30 capsule 0 08/01/2015 at Unknown time  . Prenatal Multivit-Min-Fe-FA (PRENATAL VITAMINS) 0.8 MG tablet Take 1 tablet by mouth daily. 30 tablet 12 07/31/2015 at Unknown time  . hydrocortisone cream 0.5 % Apply 1 application topically 2 (two) times daily. (Patient not taking: Reported on 08/01/2015) 30 g 0 Not Taking at Unknown time    Review of Systems  Constitutional: Negative.   HENT: Negative.   Eyes: Negative.   Respiratory: Negative.   Cardiovascular: Negative.   Gastrointestinal: Negative.   Genitourinary:       Vag burning and itching  Musculoskeletal: Negative.   Skin: Negative.   Neurological: Negative.   Endo/Heme/Allergies: Negative.   Psychiatric/Behavioral: Negative.    Physical Exam   Blood pressure 111/66, pulse 105, temperature 98.3 F (36.8 C), resp. rate 18, last menstrual period 12/01/2014.  Physical Exam  Constitutional: She is oriented to person, place, and time. She appears  well-developed and well-nourished.  HENT:  Head: Normocephalic.  Eyes: Pupils are equal, round, and reactive to light.  Neck: Normal range of motion.  Cardiovascular: Normal rate, regular rhythm, normal heart sounds and intact distal pulses.   Respiratory: Effort normal and breath sounds normal.  GI: Soft. Bowel sounds are normal.  Genitourinary: Vagina normal and uterus normal.  Musculoskeletal: Normal range of motion.  Neurological: She is alert and oriented to person, place, and time. She has normal reflexes.  Skin: Skin is warm and dry.  Psychiatric: She has a normal mood and affect. Her behavior is normal. Judgment and thought content normal.    MAU Course  Procedures  MDM Start supression therapy  Assessment and Plan  Visual inspection revels no lesions noted. Wet prep obtained and shows normal findings. Will start supression therapy at 33.4  LAWSON, [name] DARLENE 08/01/2015, 1:58 PM

## 2015-08-01 NOTE — MAU Note (Signed)
Pt presents to MAU with complaints of herpes outbreak that she noticed 3 days ago. Denies any vaginal bleeding or LOF

## 2015-08-01 NOTE — Discharge Instructions (Signed)
Herpes During and After Pregnancy °Genital herpes is a sexually transmitted infection (STI). It is caused by a virus and can be very serious during pregnancy. The greatest concern is passing the virus to the fetus and newborn. The virus is more likely to be passed to the newborn during delivery if the mother becomes infected for the first time late in her pregnancy (primary infection). It is less common for the virus to pass to the newborn if the mother had herpes before becoming pregnant. This is because antibodies against the virus develop over a period of time. These antibodies help protect the baby. Lastly, the infection can pass to the fetus through the placenta. This can happen if the mother gets herpes for the first time in the first 3 months of her pregnancy (first trimester). This can possibly cause a miscarriage or birth defects in the baby. °TREATMENT DURING PREGNANCY °Medicines may be prescribed that are safe for the mother and the fetus. The medicine can lessen symptoms or prevent a recurrence of the infection. If the infection happened before becoming pregnant, medicine may be prescribed in the last 4 weeks of the pregnancy. This can prevent a recurrent infection at the time of delivery.  °RECOMMENDED DELIVERY METHOD °If an active, recurrent infection is present at the time delivery, the baby should be delivered by cesarean delivery. This is because the virus can pass to the baby through an infected birth canal. This can cause severe problems for the baby. If the infection happens for the first time late in the pregnancy, the caregiver may also recommend a cesarean delivery. With a new infection, the body has not had the time to build up enough antibodies against the virus to protect the baby from getting the infection. °Even if the birth canal does not have visible sores (lesions) from the herpes virus, there is still that chance that the virus can spread to the baby. A cesarean delivery should be  done if there is any signs or feeling of an infection being present in the genital area. °Cesarean delivery is not recommended for women with a history of herpes infection but no evidence of active genital lesions at the time of delivery. Lesions that have crusted fully are considered healed and not active. °BREASTFEEDING  °Women infected with genital herpes can breastfeed their baby. The virus will not be present in the breast milk. If lesions are present on the breast, the baby should not breastfeed from the affected breast(s). °HOW TO PREVENT PASSING HERPES TO YOUR BABY AFTER DELIVERY °· Wash your hands with soap and water often and before touching your baby. °· If you have an outbreak, keep the area clean and covered. °· Try to avoid physical and stressful situations that may bring on an outbreak. °SEEK IMMEDIATE MEDICAL CARE IF:  °· You have an outbreak during pregnancy and cannot urinate. °· You have an outbreak anytime during your pregnancy and especially in the last 3 months of the pregnancy. °· You think you are having an allergic reaction or side effects from the medicine you are taking. °Document Released: 03/19/2001 Document Revised: 03/04/2012 Document Reviewed: 11/21/2011 °ExitCare® Patient Information ©2015 [employer], LLC. This information is not intended to replace advice given to you by your health care provider. Make sure you discuss any questions you have with your health care provider. ° °

## 2015-08-11 ENCOUNTER — Ambulatory Visit (INDEPENDENT_AMBULATORY_CARE_PROVIDER_SITE_OTHER): Payer: Self-pay | Admitting: Certified Nurse Midwife

## 2015-08-11 VITALS — BP 120/74 | HR 85 | Temp 98.2°F | Wt 151.2 lb

## 2015-08-11 DIAGNOSIS — Z3481 Encounter for supervision of other normal pregnancy, first trimester: Secondary | ICD-10-CM

## 2015-08-11 DIAGNOSIS — L299 Pruritus, unspecified: Secondary | ICD-10-CM

## 2015-08-11 DIAGNOSIS — Z3491 Encounter for supervision of normal pregnancy, unspecified, first trimester: Secondary | ICD-10-CM

## 2015-08-11 LAB — POCT URINALYSIS DIP (DEVICE)
Bilirubin Urine: NEGATIVE
Glucose, UA: NEGATIVE mg/dL
Hgb urine dipstick: NEGATIVE
Ketones, ur: NEGATIVE mg/dL
LEUKOCYTES UA: NEGATIVE
Nitrite: NEGATIVE
PH: 6.5 (ref 5.0–8.0)
PROTEIN: NEGATIVE mg/dL
SPECIFIC GRAVITY, URINE: 1.025 (ref 1.005–1.030)
UROBILINOGEN UA: 0.2 mg/dL (ref 0.0–1.0)

## 2015-08-11 NOTE — Progress Notes (Signed)
Subjective:  [name] Richards is a 19 y.o. G1P0 at [redacted]w[redacted]d being seen today for ongoing prenatal care.  Patient reports c/o itching and rash on shoulders and back..  Contractions: Not present.  Vag. Bleeding: None. Movement: Present. Denies leaking of fluid.   The following portions of the patient's history were reviewed and updated as appropriate: allergies, current medications, past family history, past medical history, past social history, past surgical history and problem list.   Objective:   Filed Vitals:   08/11/15 0913  BP: 120/74  Pulse: 85  Temp: 98.2 F (36.8 C)  Weight: 151 lb 3.2 oz (68.584 kg)    Fetal Status: Fetal Heart Rate (bpm): 141   Movement: Present     General:  Alert, oriented and cooperative. Patient is in no acute distress.  Skin: Skin is warm and dry. No rash noted.   Cardiovascular: Normal heart rate noted  Respiratory: Normal respiratory effort, no problems with respiration noted  Abdomen: Soft, gravid, appropriate for gestational age. Pain/Pressure: Present     Pelvic: Vag. Bleeding: None     Cervical exam deferred        Extremities: Normal range of motion.  Edema: Trace  Mental Status: Normal mood and affect. Normal behavior. Normal judgment and thought content.   Urinalysis: Urine Protein: Negative Urine Glucose: Negative  Assessment and Plan:  Pregnancy: G1P0 at [redacted]w[redacted]d  1. Supervision of normal pregnancy in first trimester   2. Itching Bile Acids drawn today  Preterm labor symptoms and general obstetric precautions including but not limited to vaginal bleeding, contractions, leaking of fluid and fetal movement were reviewed in detail with the patient. Please refer to After Visit Summary for other counseling recommendations.  No Follow-up on file.  RTO 1 week   Rhea Pink, CNM

## 2015-08-11 NOTE — Progress Notes (Signed)
Edema- hands/feet  

## 2015-08-11 NOTE — Patient Instructions (Signed)
Third Trimester of Pregnancy The third trimester is from week 29 through week 42, months 7 through 9. The third trimester is a time when the fetus is growing rapidly. At the end of the ninth month, the fetus is about 20 inches in length and weighs 6-10 pounds.  BODY CHANGES Your body goes through many changes during pregnancy. The changes vary from woman to woman.   Your weight will continue to increase. You can expect to gain 25-35 pounds (11-16 kg) by the end of the pregnancy.  You may begin to get stretch marks on your hips, abdomen, and breasts.  You may urinate more often because the fetus is moving lower into your pelvis and pressing on your bladder.  You may develop or continue to have heartburn as a result of your pregnancy.  You may develop constipation because certain hormones are causing the muscles that push waste through your intestines to slow down.  You may develop hemorrhoids or swollen, bulging veins (varicose veins).  You may have pelvic pain because of the weight gain and pregnancy hormones relaxing your joints between the bones in your pelvis. Backaches may result from overexertion of the muscles supporting your posture.  You may have changes in your hair. These can include thickening of your hair, rapid growth, and changes in texture. Some women also have hair loss during or after pregnancy, or hair that feels dry or thin. Your hair will most likely return to normal after your baby is born.  Your breasts will continue to grow and be tender. A yellow discharge may leak from your breasts called colostrum.  Your belly button may stick out.  You may feel short of breath because of your expanding uterus.  You may notice the fetus "dropping," or moving lower in your abdomen.  You may have a bloody mucus discharge. This usually occurs a few days to a week before labor begins.  Your cervix becomes thin and soft (effaced) near your due date. WHAT TO EXPECT AT YOUR PRENATAL  EXAMS  You will have prenatal exams every 2 weeks until week 36. Then, you will have weekly prenatal exams. During a routine prenatal visit:  You will be weighed to make sure you and the fetus are growing normally.  Your blood pressure is taken.  Your abdomen will be measured to track your baby's growth.  The fetal heartbeat will be listened to.  Any test results from the previous visit will be discussed.  You may have a cervical check near your due date to see if you have effaced. At around 36 weeks, your caregiver will check your cervix. At the same time, your caregiver will also perform a test on the secretions of the vaginal tissue. This test is to determine if a type of bacteria, Group B streptococcus, is present. Your caregiver will explain this further. Your caregiver may ask you:  What your birth plan is.  How you are feeling.  If you are feeling the baby move.  If you have had any abnormal symptoms, such as leaking fluid, bleeding, severe headaches, or abdominal cramping.  If you have any questions. Other tests or screenings that may be performed during your third trimester include:  Blood tests that check for low iron levels (anemia).  Fetal testing to check the health, activity level, and growth of the fetus. Testing is done if you have certain medical conditions or if there are problems during the pregnancy. FALSE LABOR You may feel small, irregular contractions that   eventually go away. These are called Braxton Hicks contractions, or false labor. Contractions may last for hours, days, or even weeks before true labor sets in. If contractions come at regular intervals, intensify, or become painful, it is best to be seen by your caregiver.  SIGNS OF LABOR   Menstrual-like cramps.  Contractions that are 5 minutes apart or less.  Contractions that start on the top of the uterus and spread down to the lower abdomen and back.  A sense of increased pelvic pressure or back  pain.  A watery or bloody mucus discharge that comes from the vagina. If you have any of these signs before the 37th week of pregnancy, call your caregiver right away. You need to go to the hospital to get checked immediately. HOME CARE INSTRUCTIONS   Avoid all smoking, herbs, alcohol, and unprescribed drugs. These chemicals affect the formation and growth of the baby.  Follow your caregiver's instructions regarding medicine use. There are medicines that are either safe or unsafe to take during pregnancy.  Exercise only as directed by your caregiver. Experiencing uterine cramps is a good sign to stop exercising.  Continue to eat regular, healthy meals.  Wear a good support bra for breast tenderness.  Do not use hot tubs, steam rooms, or saunas.  Wear your seat belt at all times when driving.  Avoid raw meat, uncooked cheese, cat litter boxes, and soil used by cats. These carry germs that can cause birth defects in the baby.  Take your prenatal vitamins.  Try taking a stool softener (if your caregiver approves) if you develop constipation. Eat more high-fiber foods, such as fresh vegetables or fruit and whole grains. Drink plenty of fluids to keep your urine clear or pale yellow.  Take warm sitz baths to soothe any pain or discomfort caused by hemorrhoids. Use hemorrhoid cream if your caregiver approves.  If you develop varicose veins, wear support hose. Elevate your feet for 15 minutes, 3-4 times a day. Limit salt in your diet.  Avoid heavy lifting, wear low heal shoes, and practice good posture.  Rest a lot with your legs elevated if you have leg cramps or low back pain.  Visit your dentist if you have not gone during your pregnancy. Use a soft toothbrush to brush your teeth and be gentle when you floss.  A sexual relationship may be continued unless your caregiver directs you otherwise.  Do not travel far distances unless it is absolutely necessary and only with the approval  of your caregiver.  Take prenatal classes to understand, practice, and ask questions about the labor and delivery.  Make a trial run to the hospital.  Pack your hospital bag.  Prepare the baby's nursery.  Continue to go to all your prenatal visits as directed by your caregiver. SEEK MEDICAL CARE IF:  You are unsure if you are in labor or if your water has broken.  You have dizziness.  You have mild pelvic cramps, pelvic pressure, or nagging pain in your abdominal area.  You have persistent nausea, vomiting, or diarrhea.  You have a bad smelling vaginal discharge.  You have pain with urination. SEEK IMMEDIATE MEDICAL CARE IF:   You have a fever.  You are leaking fluid from your vagina.  You have spotting or bleeding from your vagina.  You have severe abdominal cramping or pain.  You have rapid weight loss or gain.  You have shortness of breath with chest pain.  You notice sudden or extreme swelling   of your face, hands, ankles, feet, or legs.  You have not felt your baby move in over an hour.  You have severe headaches that do not go away with medicine.  You have vision changes. Document Released: 12/05/2001 Document Revised: 12/16/2013 Document Reviewed: 02/11/2013 Exit[employer] Patient Information 2015 [employer]. This information is not intended to replace advice given to you by your health care provider. Make sure you discuss any questions you have with your health care provider.  

## 2015-08-11 NOTE — Addendum Note (Signed)
Addended by: Jill Side on: 08/11/2015 01:59 PM   Modules accepted: Orders

## 2015-08-13 LAB — BILE ACIDS, TOTAL: Bile Acids Total: 19 umol/L (ref 0–19)

## 2015-08-23 ENCOUNTER — Telehealth: Payer: Self-pay

## 2015-08-23 ENCOUNTER — Emergency Department (HOSPITAL_COMMUNITY)
Admission: EM | Admit: 2015-08-23 | Discharge: 2015-08-23 | Disposition: A | Discharge status: Home / Self Care | Payer: Self-pay | Attending: Emergency Medicine | Admitting: Emergency Medicine

## 2015-08-23 ENCOUNTER — Encounter (HOSPITAL_COMMUNITY): Payer: Self-pay | Admitting: Emergency Medicine

## 2015-08-23 DIAGNOSIS — O26899 Other specified pregnancy related conditions, unspecified trimester: Secondary | ICD-10-CM

## 2015-08-23 DIAGNOSIS — O98813 Other maternal infectious and parasitic diseases complicating pregnancy, third trimester: Secondary | ICD-10-CM | POA: Insufficient documentation

## 2015-08-23 DIAGNOSIS — A6000 Herpesviral infection of urogenital system, unspecified: Secondary | ICD-10-CM | POA: Insufficient documentation

## 2015-08-23 DIAGNOSIS — Z3A36 36 weeks gestation of pregnancy: Secondary | ICD-10-CM | POA: Insufficient documentation

## 2015-08-23 DIAGNOSIS — O98513 Other viral diseases complicating pregnancy, third trimester: Secondary | ICD-10-CM

## 2015-08-23 DIAGNOSIS — Z79899 Other long term (current) drug therapy: Secondary | ICD-10-CM | POA: Insufficient documentation

## 2015-08-23 DIAGNOSIS — R109 Unspecified abdominal pain: Secondary | ICD-10-CM

## 2015-08-23 DIAGNOSIS — B009 Herpesviral infection, unspecified: Secondary | ICD-10-CM

## 2015-08-23 LAB — URINALYSIS, ROUTINE W REFLEX MICROSCOPIC
Bilirubin Urine: NEGATIVE
GLUCOSE, UA: NEGATIVE mg/dL
Hgb urine dipstick: NEGATIVE
Ketones, ur: NEGATIVE mg/dL
Leukocytes, UA: NEGATIVE
Nitrite: NEGATIVE
Protein, ur: NEGATIVE mg/dL
SPECIFIC GRAVITY, URINE: 1.008 (ref 1.005–1.030)
Urobilinogen, UA: 0.2 mg/dL (ref 0.0–1.0)
pH: 6.5 (ref 5.0–8.0)

## 2015-08-23 LAB — CBC WITH DIFFERENTIAL/PLATELET
Basophils Absolute: 0 10*3/uL (ref 0.0–0.1)
Basophils Relative: 0 % (ref 0–1)
EOS ABS: 0.4 10*3/uL (ref 0.0–0.7)
EOS PCT: 4 % (ref 0–5)
HCT: 35.2 % — ABNORMAL LOW (ref 36.0–46.0)
HEMOGLOBIN: 11 g/dL — AB (ref 12.0–15.0)
LYMPHS ABS: 1.4 10*3/uL (ref 0.7–4.0)
LYMPHS PCT: 14 % (ref 12–46)
MCH: 28.2 pg (ref 26.0–34.0)
MCHC: 31.3 g/dL (ref 30.0–36.0)
MCV: 90.3 fL (ref 78.0–100.0)
MONOS PCT: 6 % (ref 3–12)
Monocytes Absolute: 0.6 10*3/uL (ref 0.1–1.0)
NEUTROS PCT: 76 % (ref 43–77)
Neutro Abs: 7.3 10*3/uL (ref 1.7–7.7)
Platelets: 195 10*3/uL (ref 150–400)
RBC: 3.9 MIL/uL (ref 3.87–5.11)
RDW: 17.8 % — ABNORMAL HIGH (ref 11.5–15.5)
WBC: 9.6 10*3/uL (ref 4.0–10.5)

## 2015-08-23 LAB — TYPE AND SCREEN
ABO/RH(D): O POS
Antibody Screen: NEGATIVE

## 2015-08-23 LAB — COMPREHENSIVE METABOLIC PANEL
ALT: 97 U/L — ABNORMAL HIGH (ref 14–54)
ANION GAP: 8 (ref 5–15)
AST: 45 U/L — ABNORMAL HIGH (ref 15–41)
Albumin: 2.6 g/dL — ABNORMAL LOW (ref 3.5–5.0)
Alkaline Phosphatase: 269 U/L — ABNORMAL HIGH (ref 38–126)
BILIRUBIN TOTAL: 0.4 mg/dL (ref 0.3–1.2)
BUN: 5 mg/dL — ABNORMAL LOW (ref 6–20)
CALCIUM: 9.1 mg/dL (ref 8.9–10.3)
CO2: 22 mmol/L (ref 22–32)
Chloride: 106 mmol/L (ref 101–111)
Creatinine, Ser: 0.56 mg/dL (ref 0.44–1.00)
GFR calc Af Amer: >60 mL/min (ref >60)
GFR calc non Af Amer: >60 mL/min (ref >60)
Glucose, Bld: 86 mg/dL (ref 65–99)
Potassium: 3.8 mmol/L (ref 3.5–5.1)
SODIUM: 136 mmol/L (ref 135–145)
Total Protein: 6 g/dL — ABNORMAL LOW (ref 6.5–8.1)

## 2015-08-23 LAB — ABO/RH: ABO/RH(D): O POS

## 2015-08-23 LAB — PROTIME-INR
INR: 0.98 (ref 0.00–1.49)
Prothrombin Time: 13.2 seconds (ref 11.6–15.2)

## 2015-08-23 MED ORDER — DEXTROSE 5 % IV SOLN: 1.0000 g | Freq: Once | INTRAVENOUS | Status: DC

## 2015-08-23 MED ORDER — NIFEDIPINE 10 MG PO CAPS
10.0000 mg | ORAL_CAPSULE | ORAL | Status: AC
  Administered 2015-08-23: 10 mg via ORAL
  Filled 2015-08-23: qty 1

## 2015-08-23 MED ORDER — NIFEDIPINE 10 MG PO CAPS
10.0000 mg | ORAL_CAPSULE | Freq: Once | ORAL | Status: AC
  Administered 2015-08-23: 10 mg via ORAL
  Filled 2015-08-23: qty 1

## 2015-08-23 MED ORDER — ACYCLOVIR 400 MG PO TABS: 400.0000 mg | ORAL_TABLET | Freq: Three times a day (TID) | ORAL | Status: AC

## 2015-08-23 MED ORDER — GI COCKTAIL ~~LOC~~
30.0000 mL | Freq: Once | ORAL | Status: AC
  Administered 2015-08-23: 30 mL via ORAL
  Filled 2015-08-23: qty 30

## 2015-08-23 MED ORDER — VALACYCLOVIR HCL 500 MG PO TABS
1000.0000 mg | ORAL_TABLET | Freq: Every day | ORAL | Status: DC
  Filled 2015-08-23: qty 2

## 2015-08-23 MED ORDER — LACTATED RINGERS IV BOLUS (SEPSIS)
1000.0000 mL | Freq: Once | INTRAVENOUS | Status: AC
  Administered 2015-08-23: 1000 mL via INTRAVENOUS

## 2015-08-23 MED ORDER — ACYCLOVIR 200 MG PO CAPS
400.0000 mg | ORAL_CAPSULE | Freq: Three times a day (TID) | ORAL | Status: DC
  Administered 2015-08-23: 400 mg via ORAL
  Filled 2015-08-23: qty 2

## 2015-08-23 NOTE — ED Notes (Signed)
This note also relates to the following rows which could not be included: Pulse Rate - Cannot attach notes to unvalidated device data SpO2 - Cannot attach notes to unvalidated device data   Reported to Dr. Adrian Blackwater patient still having UCs q1-27min, palpate mild, patient reports pain decreased from 10 to 5. UA is negative for UTI. Patient reports itching, had some increased bile salts. Dr Adrian Blackwater ordered repeat SVE and ordered procardia. Dr will call OB R[facility] to review plan of care.

## 2015-08-23 NOTE — Telephone Encounter (Signed)
Called pt and FOB picked up and stated that he was at the store when he gets home he will have her give [place] a call.

## 2015-08-23 NOTE — Progress Notes (Signed)
Rrob calls dr Adrian Blackwater to inform him that pt presents this evening with lower abdominal pain that comes and goes which began several hrs ago.  Ultrasound at bedside and reports that fetus is in the vertex position. Dr Adrian Blackwater asks for pt to be checked. Cervix is 0.5/thick/high. MD gives orders for LR bolus of 1 liter, uranalysis, and valtrex.  Continue to monitor pt at this time

## 2015-08-23 NOTE — ED Provider Notes (Signed)
CSN: [id number]     Arrival date & time 08/23/15  1730 History   First MD Initiated Contact with Patient 08/23/15 1748     Chief Complaint  Patient presents with  . Abdominal Pain     (Consider location/radiation/quality/duration/timing/severity/associated sxs/prior Treatment) Patient is a 19 y.o. female presenting with cramps.  Abdominal Cramping This is a new problem. The current episode started today. The problem occurs constantly. The problem has been unchanged. Associated symptoms include abdominal pain. Pertinent negatives include no arthralgias, chest pain, chills, congestion, coughing, fever, headaches, myalgias, nausea, rash, sore throat or vomiting. Associated symptoms comments: contractions. Nothing aggravates the symptoms. She has tried nothing for the symptoms.    Past Medical History  Diagnosis Date  . Medical history non-contributory    Past Surgical History  Procedure Laterality Date  . Excision vaginal cyst     Family History  Problem Relation Age of Onset  . Asthma Father   . Diabetes Maternal Grandmother   . Diabetes Maternal Grandfather    Social History  Substance Use Topics  . Smoking status: Never Smoker   . Smokeless tobacco: None  . Alcohol Use: No   OB History    Gravida Para Term Preterm AB TAB SAB Ectopic Multiple Living   1              Review of Systems  Constitutional: Negative for fever and chills.  HENT: Negative for congestion and sore throat.   Eyes: Negative for visual disturbance.  Respiratory: Negative for cough, shortness of breath and wheezing.   Cardiovascular: Negative for chest pain.  Gastrointestinal: Positive for abdominal pain. Negative for nausea, vomiting, diarrhea and constipation.  Genitourinary: Positive for pelvic pain. Negative for dysuria, vaginal bleeding, vaginal discharge, difficulty urinating and vaginal pain.  Musculoskeletal: Negative for myalgias and arthralgias.  Skin: Negative for rash.  Neurological:  Negative for syncope and headaches.  Psychiatric/Behavioral: Negative for behavioral problems.  All other systems reviewed and are negative.     Allergies  Review of patient's allergies indicates no known allergies.  Home Medications   Prior to Admission medications   Medication Sig Start Date End Date Taking? Authorizing Provider  Prenatal Multivit-Min-Fe-FA (PRENATAL VITAMINS) 0.8 MG tablet Take 1 tablet by mouth daily. 03/03/15  Yes Marlis Edelson, CNM  acyclovir (ZOVIRAX) 400 MG tablet Take 1 tablet (400 mg total) by mouth 3 (three) times daily. 08/23/15 08/30/15  Beverely Risen, MD  hydrocortisone cream 0.5 % Apply 1 application topically 2 (two) times daily. Patient not taking: Reported on 08/01/2015 07/28/15   Wilmer Floor Leftwich-Kirby, CNM  hydrOXYzine (VISTARIL) 25 MG capsule Take 1 capsule (25 mg total) by mouth 3 (three) times daily as needed. Patient not taking: Reported on 08/23/2015 07/28/15   Misty Stanley A Leftwich-Kirby, CNM   BP 101/78 mmHg  Pulse 81  Temp(Src) 98.2 F (36.8 C) (Oral)  Resp 16  SpO2 99%  LMP 12/01/2014 Physical Exam  Constitutional: She is oriented to person, place, and time. She appears well-developed and well-nourished. She appears distressed.  HENT:  Head: Normocephalic and atraumatic.  Eyes: EOM are normal.  Neck: Normal range of motion.  Cardiovascular: Normal rate, regular rhythm and normal heart sounds.   No murmur heard. Pulmonary/Chest: Effort normal and breath sounds normal. No respiratory distress. She has no wheezes.  Abdominal: Soft. She exhibits distension. There is no tenderness.  Musculoskeletal: She exhibits no edema.  Neurological: She is alert and oriented to person, place, and time.  Skin: She is  not diaphoretic.  Psychiatric: She has a normal mood and affect. Her behavior is normal.    ED Course  Procedures (including critical care time) Labs Review Labs Reviewed  CBC WITH DIFFERENTIAL/PLATELET - Abnormal; Notable for the following:     Hemoglobin 11.0 (*)    HCT 35.2 (*)    RDW 17.8 (*)    All other components within normal limits  COMPREHENSIVE METABOLIC PANEL - Abnormal; Notable for the following:    BUN 5 (*)    Total Protein 6.0 (*)    Albumin 2.6 (*)    AST 45 (*)    ALT 97 (*)    Alkaline Phosphatase 269 (*)    All other components within normal limits  URINE CULTURE  PROTIME-INR  URINALYSIS, ROUTINE W REFLEX MICROSCOPIC (NOT AT [facility])  TYPE AND SCREEN  ABO/RH    Imaging Review No results found. I have personally reviewed and evaluated these images and lab results as part of my medical decision-making.   EKG Interpretation None      MDM   Final diagnoses:  Herpes infection during pregnancy in third trimester  Abdominal pain affecting pregnancy     Patient is a 19 year old female with a past medical history significant for genital herpes that is 35 weeks and 4 days pregnant. The patient is a G1 P0, with active genital herpes infection. Patient states that 2 hours ago she began having contractions and she states they've been constant. On arrival to the ED the patient is tachycardic otherwise stable. On exam the patient has no obvious crowning and no exam was deferred to the OB rapid nurse. Patient was given fluids and tocolytics. Patient's toco was very reassuring. Patient had resolutions of contractions. During this patient did have some substernal burning was given a GI cocktail and had complete resolution. Patient's UA did not show any infection. The patient will be discharged with a prescription for acyclovir and follow-up with OB.    Beverely Risen, MD 08/23/15 0630  Derwood Kaplan, MD 08/27/15 1601

## 2015-08-23 NOTE — ED Notes (Signed)
Reported patient given 2 doses procardia, still having UCs q1-4 but patient now reports no pain with UCs. SVE unchanged from earlier. Dr Adrian Blackwater ordered ok to discharge patient with directions to go to MAU if UCs worsen. Patient to go home with prescription for acyclovir. Patient understands to come to MAU at [facility] hospital if UC pain worses, she has decreased fetal movement or leaking of fluid.

## 2015-08-23 NOTE — ED Notes (Signed)
Pt c/o abd pain in lower area that is intermittent in nature x 2 hours; pt denies leaking fluid or bleeding; pt [redacted] weeks pregnant with prenatal care

## 2015-08-23 NOTE — Telephone Encounter (Signed)
Pt called and stated that she was trying to speak with a nurse.

## 2015-08-23 NOTE — ED Notes (Signed)
Pt placed on fetal heart monitor and toco monitor. MD at bedside. Rapid OB in transit

## 2015-08-23 NOTE — ED Notes (Signed)
Pt complaining of lower abd pain feeling like she is having contractions.

## 2015-08-23 NOTE — Discharge Instructions (Signed)

## 2015-08-24 LAB — URINE CULTURE: Culture: 30000

## 2015-08-24 NOTE — Telephone Encounter (Signed)
Called pt and pt informed me that she has been taken care of concerning her medication.  Pt did not have any other questions.

## 2015-08-25 ENCOUNTER — Telehealth (HOSPITAL_COMMUNITY): Payer: Self-pay | Admitting: Emergency Medicine

## 2015-08-25 ENCOUNTER — Ambulatory Visit (INDEPENDENT_AMBULATORY_CARE_PROVIDER_SITE_OTHER): Payer: Self-pay | Admitting: Certified Nurse Midwife

## 2015-08-25 VITALS — BP 117/73 | HR 97 | Temp 98.4°F | Wt 154.8 lb

## 2015-08-25 DIAGNOSIS — Z113 Encounter for screening for infections with a predominantly sexual mode of transmission: Secondary | ICD-10-CM

## 2015-08-25 DIAGNOSIS — L299 Pruritus, unspecified: Secondary | ICD-10-CM

## 2015-08-25 DIAGNOSIS — Z3481 Encounter for supervision of other normal pregnancy, first trimester: Secondary | ICD-10-CM

## 2015-08-25 DIAGNOSIS — Z3491 Encounter for supervision of normal pregnancy, unspecified, first trimester: Secondary | ICD-10-CM

## 2015-08-25 DIAGNOSIS — Z118 Encounter for screening for other infectious and parasitic diseases: Secondary | ICD-10-CM

## 2015-08-25 LAB — POCT URINALYSIS DIP (DEVICE)
Bilirubin Urine: NEGATIVE
Glucose, UA: NEGATIVE mg/dL
Hgb urine dipstick: NEGATIVE
Ketones, ur: NEGATIVE mg/dL
Leukocytes, UA: NEGATIVE
Nitrite: NEGATIVE
Protein, ur: NEGATIVE mg/dL
Specific Gravity, Urine: 1.015 (ref 1.005–1.030)
Urobilinogen, UA: 0.2 mg/dL (ref 0.0–1.0)
pH: 6.5 (ref 5.0–8.0)

## 2015-08-25 LAB — OB RESULTS CONSOLE GBS: STREP GROUP B AG: POSITIVE

## 2015-08-25 NOTE — Patient Instructions (Signed)
Third Trimester of Pregnancy The third trimester is from week 29 through week 42, months 7 through 9. The third trimester is a time when the fetus is growing rapidly. At the end of the ninth month, the fetus is about 20 inches in length and weighs 6-10 pounds.  BODY CHANGES Your body goes through many changes during pregnancy. The changes vary from woman to woman.   Your weight will continue to increase. You can expect to gain 25-35 pounds (11-16 kg) by the end of the pregnancy.  You may begin to get stretch marks on your hips, abdomen, and breasts.  You may urinate more often because the fetus is moving lower into your pelvis and pressing on your bladder.  You may develop or continue to have heartburn as a result of your pregnancy.  You may develop constipation because certain hormones are causing the muscles that push waste through your intestines to slow down.  You may develop hemorrhoids or swollen, bulging veins (varicose veins).  You may have pelvic pain because of the weight gain and pregnancy hormones relaxing your joints between the bones in your pelvis. Backaches may result from overexertion of the muscles supporting your posture.  You may have changes in your hair. These can include thickening of your hair, rapid growth, and changes in texture. Some women also have hair loss during or after pregnancy, or hair that feels dry or thin. Your hair will most likely return to normal after your baby is born.  Your breasts will continue to grow and be tender. A yellow discharge may leak from your breasts called colostrum.  Your belly button may stick out.  You may feel short of breath because of your expanding uterus.  You may notice the fetus "dropping," or moving lower in your abdomen.  You may have a bloody mucus discharge. This usually occurs a few days to a week before labor begins.  Your cervix becomes thin and soft (effaced) near your due date. WHAT TO EXPECT AT YOUR PRENATAL  EXAMS  You will have prenatal exams every 2 weeks until week 36. Then, you will have weekly prenatal exams. During a routine prenatal visit:  You will be weighed to make sure you and the fetus are growing normally.  Your blood pressure is taken.  Your abdomen will be measured to track your baby's growth.  The fetal heartbeat will be listened to.  Any test results from the previous visit will be discussed.  You may have a cervical check near your due date to see if you have effaced. At around 36 weeks, your caregiver will check your cervix. At the same time, your caregiver will also perform a test on the secretions of the vaginal tissue. This test is to determine if a type of bacteria, Group B streptococcus, is present. Your caregiver will explain this further. Your caregiver may ask you:  What your birth plan is.  How you are feeling.  If you are feeling the baby move.  If you have had any abnormal symptoms, such as leaking fluid, bleeding, severe headaches, or abdominal cramping.  If you have any questions. Other tests or screenings that may be performed during your third trimester include:  Blood tests that check for low iron levels (anemia).  Fetal testing to check the health, activity level, and growth of the fetus. Testing is done if you have certain medical conditions or if there are problems during the pregnancy. FALSE LABOR You may feel small, irregular contractions that   eventually go away. These are called Braxton Hicks contractions, or false labor. Contractions may last for hours, days, or even weeks before true labor sets in. If contractions come at regular intervals, intensify, or become painful, it is best to be seen by your caregiver.  SIGNS OF LABOR   Menstrual-like cramps.  Contractions that are 5 minutes apart or less.  Contractions that start on the top of the uterus and spread down to the lower abdomen and back.  A sense of increased pelvic pressure or back  pain.  A watery or bloody mucus discharge that comes from the vagina. If you have any of these signs before the 37th week of pregnancy, call your caregiver right away. You need to go to the hospital to get checked immediately. HOME CARE INSTRUCTIONS   Avoid all smoking, herbs, alcohol, and unprescribed drugs. These chemicals affect the formation and growth of the baby.  Follow your caregiver's instructions regarding medicine use. There are medicines that are either safe or unsafe to take during pregnancy.  Exercise only as directed by your caregiver. Experiencing uterine cramps is a good sign to stop exercising.  Continue to eat regular, healthy meals.  Wear a good support bra for breast tenderness.  Do not use hot tubs, steam rooms, or saunas.  Wear your seat belt at all times when driving.  Avoid raw meat, uncooked cheese, cat litter boxes, and soil used by cats. These carry germs that can cause birth defects in the baby.  Take your prenatal vitamins.  Try taking a stool softener (if your caregiver approves) if you develop constipation. Eat more high-fiber foods, such as fresh vegetables or fruit and whole grains. Drink plenty of fluids to keep your urine clear or pale yellow.  Take warm sitz baths to soothe any pain or discomfort caused by hemorrhoids. Use hemorrhoid cream if your caregiver approves.  If you develop varicose veins, wear support hose. Elevate your feet for 15 minutes, 3-4 times a day. Limit salt in your diet.  Avoid heavy lifting, wear low heal shoes, and practice good posture.  Rest a lot with your legs elevated if you have leg cramps or low back pain.  Visit your dentist if you have not gone during your pregnancy. Use a soft toothbrush to brush your teeth and be gentle when you floss.  A sexual relationship may be continued unless your caregiver directs you otherwise.  Do not travel far distances unless it is absolutely necessary and only with the approval  of your caregiver.  Take prenatal classes to understand, practice, and ask questions about the labor and delivery.  Make a trial run to the hospital.  Pack your hospital bag.  Prepare the baby's nursery.  Continue to go to all your prenatal visits as directed by your caregiver. SEEK MEDICAL CARE IF:  You are unsure if you are in labor or if your water has broken.  You have dizziness.  You have mild pelvic cramps, pelvic pressure, or nagging pain in your abdominal area.  You have persistent nausea, vomiting, or diarrhea.  You have a bad smelling vaginal discharge.  You have pain with urination. SEEK IMMEDIATE MEDICAL CARE IF:   You have a fever.  You are leaking fluid from your vagina.  You have spotting or bleeding from your vagina.  You have severe abdominal cramping or pain.  You have rapid weight loss or gain.  You have shortness of breath with chest pain.  You notice sudden or extreme swelling   of your face, hands, ankles, feet, or legs.  You have not felt your baby move in over an hour.  You have severe headaches that do not go away with medicine.  You have vision changes. Document Released: 12/05/2001 Document Revised: 12/16/2013 Document Reviewed: 02/11/2013 Exit[employer] Patient Information 2015 [employer]. This information is not intended to replace advice given to you by your health care provider. Make sure you discuss any questions you have with your health care provider.  

## 2015-08-25 NOTE — Progress Notes (Signed)
  Subjective:  [name] Richards is a 19 y.o. G1P0 at [redacted]w[redacted]d being seen today for ongoing prenatal care.  Patient reports itching.  Contractions: Irregular.  Vag. Bleeding: None. Movement: Present. Denies leaking of fluid.   The following portions of the patient's history were reviewed and updated as appropriate: allergies, current medications, past family history, past medical history, past social history, past surgical history and problem list.   Objective:   Filed Vitals:   08/25/15 1004  BP: 117/73  Pulse: 97  Temp: 98.4 F (36.9 C)  Weight: 154 lb 12.8 oz (70.217 kg)    Fetal Status: Fetal Heart Rate (bpm): 145   Movement: Present     General:  Alert, oriented and cooperative. Patient is in no acute distress.  Skin: Skin is warm and dry. No rash noted.   Cardiovascular: Normal heart rate noted  Respiratory: Normal respiratory effort, no problems with respiration noted  Abdomen: Soft, gravid, appropriate for gestational age. Pain/Pressure: Present     Pelvic: Vag. Bleeding: None     Cervical exam performed        Extremities: Normal range of motion.  Edema: Trace  Mental Status: Normal mood and affect. Normal behavior. Normal judgment and thought content.   Urinalysis:      Assessment and Plan:  Pregnancy: G1P0 at [redacted]w[redacted]d  1. Itching  - Culture, beta strep (group b only) - Urine cytology ancillary only - POCT urinalysis dip (device) - Bile acids, total  2. Supervision of normal pregnancy in first trimester  - Culture, beta strep (group b only) - Urine cytology ancillary only - POCT urinalysis dip (device) - Bile acids, total  Term labor symptoms and general obstetric precautions including but not limited to vaginal bleeding, contractions, leaking of fluid and fetal movement were reviewed in detail with the patient. Please refer to After Visit Summary for other counseling recommendations.  Return in about 1 week (around 09/01/2015).   Rhea Pink, CNM

## 2015-08-25 NOTE — Progress Notes (Signed)
ED Antimicrobial Stewardship Positive Culture Follow Up   [name] Richards is an 19 y.o. female who presented to [facility] on 08/23/2015 with a chief complaint of  Chief Complaint  Patient presents with  . Abdominal Pain    Recent Results (from the past 720 hour(s))  Wet prep, genital     Status: Abnormal   Collection Time: 08/01/15  1:50 PM  Result Value Ref Range Status   Yeast Wet Prep HPF POC NONE SEEN NONE SEEN Final   Trich, Wet Prep NONE SEEN NONE SEEN Final   Clue Cells Wet Prep HPF POC NONE SEEN NONE SEEN Final   WBC, Wet Prep HPF POC FEW (A) NONE SEEN Final    Comment: FEW BACTERIA SEEN  Urine culture     Status: None   Collection Time: 08/23/15  6:46 PM  Result Value Ref Range Status   Specimen Description URINE, RANDOM  Final   Special Requests NONE  Final   Culture   Final    30,000 COLONIES/mL GROUP B STREP(S.AGALACTIAE)ISOLATED TESTING AGAINST S. AGALACTIAE NOT ROUTINELY PERFORMED DUE TO PREDICTABILITY OF AMP/PEN/VAN SUSCEPTIBILITY.    Report Status 08/24/2015 FINAL  Final    Patient discharged originally without antimicrobial agent and treatment is now indicated  New antibiotic prescription: Amoxicillin 500 mg, 1 tablet by mouth every 8 hours for 5 days.  ED Provider: Teressa Lower, NP   [name] 08/25/2015, 9:12 AM Infectious Diseases Pharmacist Phone# [phone]

## 2015-08-26 ENCOUNTER — Telehealth (HOSPITAL_COMMUNITY): Payer: Self-pay

## 2015-08-26 LAB — CULTURE, BETA STREP (GROUP B ONLY)

## 2015-08-26 LAB — BILE ACIDS, TOTAL: Bile Acids Total: 12 umol/L (ref 0–19)

## 2015-08-26 LAB — URINE CYTOLOGY ANCILLARY ONLY
Chlamydia: NEGATIVE
Neisseria Gonorrhea: NEGATIVE

## 2015-08-29 ENCOUNTER — Telehealth (HOSPITAL_COMMUNITY): Payer: Self-pay | Admitting: Emergency Medicine

## 2015-08-29 NOTE — Telephone Encounter (Signed)
Post ED Visit - Positive Culture Follow-up: Successful Patient Follow-Up  Culture assessed and recommendations reviewed by:  Celedonio Miyamoto, Pharm.D., BCPS-AQ ID  Georgina Pillion, Pharm.D., BCPS  [place], [address].D., BCPS, AAHIVP  Estella Husk, Pharm.D., BCPS, AAHIVP  Tegan Magsam, Pharm.D.  Casilda Carls, Pharm.D.  Positive urine culture   Patient discharged without antimicrobial prescription and treatment is now indicated  Organism is resistant to prescribed ED discharge antimicrobial  Patient with positive blood cultures  Changes discussed with ED provider: Teressa Lower PA New antibiotic prescription: Amoxicillin 500 mg PO every eight hours for five days Called to [facility] [phone]  Contacted patient, date 08/29/15, time 1329 ID verified, patient notified of positive urine culture and need for treatment. RX Amoxicillin called to [facility] [phone]   [name] 08/29/2015, 1:34 PM

## 2015-09-01 ENCOUNTER — Encounter (HOSPITAL_COMMUNITY): Payer: Self-pay | Admitting: *Deleted

## 2015-09-01 ENCOUNTER — Inpatient Hospital Stay (HOSPITAL_COMMUNITY)
Admission: AD | Admit: 2015-09-01 | Discharge: 2015-09-01 | Disposition: A | Discharge status: Home / Self Care | Payer: Self-pay | Source: Ambulatory Visit | Attending: Family Medicine | Admitting: Family Medicine

## 2015-09-01 ENCOUNTER — Encounter: Payer: Self-pay | Admitting: Obstetrics and Gynecology

## 2015-09-01 ENCOUNTER — Inpatient Hospital Stay (HOSPITAL_COMMUNITY)
Admission: AD | Admit: 2015-09-01 | Discharge: 2015-09-01 | Disposition: A | Discharge status: Home / Self Care | Payer: Self-pay | Source: Ambulatory Visit | Attending: Obstetrics & Gynecology | Admitting: Obstetrics & Gynecology

## 2015-09-01 DIAGNOSIS — A609 Anogenital herpesviral infection, unspecified: Secondary | ICD-10-CM

## 2015-09-01 DIAGNOSIS — O98519 Other viral diseases complicating pregnancy, unspecified trimester: Secondary | ICD-10-CM

## 2015-09-01 DIAGNOSIS — Z3493 Encounter for supervision of normal pregnancy, unspecified, third trimester: Secondary | ICD-10-CM | POA: Insufficient documentation

## 2015-09-01 DIAGNOSIS — O471 False labor at or after 37 completed weeks of gestation: Secondary | ICD-10-CM

## 2015-09-01 DIAGNOSIS — Z3491 Encounter for supervision of normal pregnancy, unspecified, first trimester: Secondary | ICD-10-CM

## 2015-09-01 DIAGNOSIS — O98319 Other infections with a predominantly sexual mode of transmission complicating pregnancy, unspecified trimester: Secondary | ICD-10-CM

## 2015-09-01 DIAGNOSIS — A6009 Herpesviral infection of other urogenital tract: Secondary | ICD-10-CM

## 2015-09-01 DIAGNOSIS — L299 Pruritus, unspecified: Secondary | ICD-10-CM

## 2015-09-01 DIAGNOSIS — Z3A38 38 weeks gestation of pregnancy: Secondary | ICD-10-CM | POA: Insufficient documentation

## 2015-09-01 DIAGNOSIS — R21 Rash and other nonspecific skin eruption: Secondary | ICD-10-CM | POA: Insufficient documentation

## 2015-09-01 DIAGNOSIS — O479 False labor, unspecified: Secondary | ICD-10-CM

## 2015-09-01 MED ORDER — OXYCODONE-ACETAMINOPHEN 5-325 MG PO TABS
2.0000 | ORAL_TABLET | Freq: Once | ORAL | Status: AC
  Administered 2015-09-01: 2 via ORAL
  Filled 2015-09-01: qty 2

## 2015-09-01 MED ORDER — HYDROXYZINE HCL 50 MG/ML IM SOLN
25.0000 mg | Freq: Four times a day (QID) | INTRAMUSCULAR | Status: DC | PRN
  Administered 2015-09-01: 25 mg via INTRAMUSCULAR
  Filled 2015-09-01 (×2): qty 0.5

## 2015-09-01 NOTE — Discharge Instructions (Signed)
Fetal Movement Counts °Patient Name: __________________________________________________ Patient Due Date: ____________________ °Performing a fetal movement count is highly recommended in high-risk pregnancies, but it is good for every pregnant woman to do. Your health care provider may ask you to start counting fetal movements at 28 weeks of the pregnancy. Fetal movements often increase: °· After eating a full meal. °· After physical activity. °· After eating or drinking something sweet or cold. °· At rest. °Pay attention to when you feel the baby is most active. This will help you notice a pattern of your baby's sleep and wake cycles and what factors contribute to an increase in fetal movement. It is important to perform a fetal movement count at the same time each day when your baby is normally most active.  °HOW TO COUNT FETAL MOVEMENTS °1. Find a quiet and comfortable area to sit or lie down on your left side. Lying on your left side provides the best blood and oxygen circulation to your baby. °2. Write down the day and time on a sheet of paper or in a journal. °3. Start counting kicks, flutters, swishes, rolls, or jabs in a 2-hour period. You should feel at least 10 movements within 2 hours. °4. If you do not feel 10 movements in 2 hours, wait 2-3 hours and count again. Look for a change in the pattern or not enough counts in 2 hours. °SEEK MEDICAL CARE IF: °· You feel less than 10 counts in 2 hours, tried twice. °· There is no movement in over an hour. °· The pattern is changing or taking longer each day to reach 10 counts in 2 hours. °· You feel the baby is not moving as he or she usually does. °Date: ____________ Movements: ____________ Start time: ____________ Finish time: ____________  °Date: ____________ Movements: ____________ Start time: ____________ Finish time: ____________ °Date: ____________ Movements: ____________ Start time: ____________ Finish time: ____________ °Date: ____________ Movements:  ____________ Start time: ____________ Finish time: ____________ °Date: ____________ Movements: ____________ Start time: ____________ Finish time: ____________ °Date: ____________ Movements: ____________ Start time: ____________ Finish time: ____________ °Date: ____________ Movements: ____________ Start time: ____________ Finish time: ____________ °Date: ____________ Movements: ____________ Start time: ____________ Finish time: ____________  °Date: ____________ Movements: ____________ Start time: ____________ Finish time: ____________ °Date: ____________ Movements: ____________ Start time: ____________ Finish time: ____________ °Date: ____________ Movements: ____________ Start time: ____________ Finish time: ____________ °Date: ____________ Movements: ____________ Start time: ____________ Finish time: ____________ °Date: ____________ Movements: ____________ Start time: ____________ Finish time: ____________ °Date: ____________ Movements: ____________ Start time: ____________ Finish time: ____________ °Date: ____________ Movements: ____________ Start time: ____________ Finish time: ____________  °Date: ____________ Movements: ____________ Start time: ____________ Finish time: ____________ °Date: ____________ Movements: ____________ Start time: ____________ Finish time: ____________ °Date: ____________ Movements: ____________ Start time: ____________ Finish time: ____________ °Date: ____________ Movements: ____________ Start time: ____________ Finish time: ____________ °Date: ____________ Movements: ____________ Start time: ____________ Finish time: ____________ °Date: ____________ Movements: ____________ Start time: ____________ Finish time: ____________ °Date: ____________ Movements: ____________ Start time: ____________ Finish time: ____________  °Date: ____________ Movements: ____________ Start time: ____________ Finish time: ____________ °Date: ____________ Movements: ____________ Start time: ____________ Finish  time: ____________ °Date: ____________ Movements: ____________ Start time: ____________ Finish time: ____________ °Date: ____________ Movements: ____________ Start time: ____________ Finish time: ____________ °Date: ____________ Movements: ____________ Start time: ____________ Finish time: ____________ °Date: ____________ Movements: ____________ Start time: ____________ Finish time: ____________ °Date: ____________ Movements: ____________ Start time: ____________ Finish time: ____________  °Date: ____________ Movements: ____________ Start time: ____________ Finish   time: ____________ °Date: ____________ Movements: ____________ Start time: ____________ Finish time: ____________ °Date: ____________ Movements: ____________ Start time: ____________ Finish time: ____________ °Date: ____________ Movements: ____________ Start time: ____________ Finish time: ____________ °Date: ____________ Movements: ____________ Start time: ____________ Finish time: ____________ °Date: ____________ Movements: ____________ Start time: ____________ Finish time: ____________ °Date: ____________ Movements: ____________ Start time: ____________ Finish time: ____________  °Date: ____________ Movements: ____________ Start time: ____________ Finish time: ____________ °Date: ____________ Movements: ____________ Start time: ____________ Finish time: ____________ °Date: ____________ Movements: ____________ Start time: ____________ Finish time: ____________ °Date: ____________ Movements: ____________ Start time: ____________ Finish time: ____________ °Date: ____________ Movements: ____________ Start time: ____________ Finish time: ____________ °Date: ____________ Movements: ____________ Start time: ____________ Finish time: ____________ °Date: ____________ Movements: ____________ Start time: ____________ Finish time: ____________  °Date: ____________ Movements: ____________ Start time: ____________ Finish time: ____________ °Date: ____________  Movements: ____________ Start time: ____________ Finish time: ____________ °Date: ____________ Movements: ____________ Start time: ____________ Finish time: ____________ °Date: ____________ Movements: ____________ Start time: ____________ Finish time: ____________ °Date: ____________ Movements: ____________ Start time: ____________ Finish time: ____________ °Date: ____________ Movements: ____________ Start time: ____________ Finish time: ____________ °Date: ____________ Movements: ____________ Start time: ____________ Finish time: ____________  °Date: ____________ Movements: ____________ Start time: ____________ Finish time: ____________ °Date: ____________ Movements: ____________ Start time: ____________ Finish time: ____________ °Date: ____________ Movements: ____________ Start time: ____________ Finish time: ____________ °Date: ____________ Movements: ____________ Start time: ____________ Finish time: ____________ °Date: ____________ Movements: ____________ Start time: ____________ Finish time: ____________ °Date: ____________ Movements: ____________ Start time: ____________ Finish time: ____________ °Document Released: 01/10/2007 Document Revised: 04/27/2014 Document Reviewed: 10/07/2012 °Exit[employer]® Patient Information ©2015 [employer]. This information is not intended to replace advice given to you by your health care provider. Make sure you discuss any questions you have with your health care provider. °Vaginal Delivery °During delivery, your health care provider will help you give birth to your baby. During a vaginal delivery, you will work to push the baby out of your vagina. However, before you can push your baby out, a few things need to happen. The opening of your uterus (cervix) has to soften, thin out, and open up (dilate) all the way to 10 cm. Also, your baby has to move down from the uterus into your vagina.  °SIGNS OF LABOR  °Your health care provider will first need to make sure you are in labor.  Signs of labor include:  °· Passing what is called the mucous plug before labor begins. This is a small amount of blood-stained mucus. °· Having regular, painful uterine contractions.   °· The time between contractions gets shorter.   °· The discomfort and pain gradually get more intense. °· Contraction pains get worse when walking and do not go away when resting.   °· Your cervix becomes thinner (effacement) and dilates. °BEFORE THE DELIVERY °Once you are in labor and admitted into the hospital or care center, your health care provider may do the following:  °5. Perform a complete physical exam. °6. Review any complications related to pregnancy or labor.  °7. Check your blood pressure, pulse, temperature, and heart rate (vital signs).   °8. Determine if, and when, the rupture of amniotic membranes occurred. °9. Do a vaginal exam (using a sterile glove and lubricant) to determine:   °1. The position (presentation) of the baby. Is the baby's head presenting first (vertex) in the birth canal (vagina), or are the feet or buttocks first (breech)?   °2. The level (station) of the baby's head within the birth canal.   °3.   The effacement and dilatation of the cervix.   °10. An electronic fetal monitor is usually placed on your abdomen when you first arrive. This is used to monitor your contractions and the baby's heart rate. °1. When the monitor is on your abdomen (external fetal monitor), it can only pick up the frequency and length of your contractions. It cannot tell the strength of your contractions. °2. If it becomes necessary for your health care provider to know exactly how strong your contractions are or to see exactly what the baby's heart rate is doing, an internal monitor may be inserted into your vagina and uterus. Your health care provider will discuss the benefits and risks of using an internal monitor and obtain your permission before inserting the device. °3. Continuous fetal monitoring may be needed if you  have an epidural, are receiving certain medicines (such as oxytocin), or have pregnancy or labor complications. °11. An IV access tube may be placed into a vein in your arm to deliver fluids and medicines if necessary. °THREE STAGES OF LABOR AND DELIVERY °Normal labor and delivery is divided into three stages. °First Stage °This stage starts when you begin to contract regularly and your cervix begins to efface and dilate. It ends when your cervix is completely open (fully dilated). The first stage is the longest stage of labor and can last from 3 hours to 15 hours.  °Several methods are available to help with labor pain. You and your health care provider will decide which option is best for you. Options include:  °· Opioid medicines. These are strong pain medicines that you can get through your IV tube or as a shot into your muscle. These medicines lessen pain but do not make it go away completely.  °· Epidural. A medicine is given through a thin tube that is inserted in your back. The medicine numbs the lower part of your body and prevents any pain in that area. °· Paracervical pain medicine. This is an injection of an anesthetic on each side of your cervix.   °· You may request natural childbirth, which does not involve the use of pain medicines or an epidural during labor and delivery. Instead, you will use other things, such as breathing exercises, to help cope with the pain. °Second Stage °The second stage of labor begins when your cervix is fully dilated at 10 cm. It continues until you push your baby down through the birth canal and the baby is born. This stage can take only minutes or several hours. °· The location of your baby's head as it moves through the birth canal is reported as a number called a station. If the baby's head has not started its descent, the station is described as being at minus 3 (-3). When your baby's head is at the zero station, it is at the middle of the birth canal and is engaged  in the pelvis. The station of your baby helps indicate the progress of the second stage of labor. °· When your baby is born, your health care provider may hold the baby with his or her head lowered to prevent amniotic fluid, mucus, and blood from getting into the baby's lungs. The baby's mouth and nose may be suctioned with a small bulb syringe to remove any additional fluid. °· Your health care provider may then place the baby on your stomach. It is important to keep the baby from getting cold. To do this, the health care provider will dry the baby off, place the   baby directly on your skin (with no blankets between you and the baby), and cover the baby with warm, dry blankets.   °· The umbilical cord is cut. °Third Stage °During the third stage of labor, your health care provider will deliver the placenta (afterbirth) and make sure your bleeding is under control. The delivery of the placenta usually takes about 5 minutes but can take up to 30 minutes. After the placenta is delivered, a medicine may be given either by IV or injection to help contract the uterus and control bleeding. If you are planning to breastfeed, you can try to do so now. °After you deliver the placenta, your uterus should contract and get very firm. If your uterus does not remain firm, your health care provider will massage it. This is important because the contraction of the uterus helps cut off bleeding at the site where the placenta was attached to your uterus. If your uterus does not contract properly and stay firm, you may continue to bleed heavily. If there is a lot of bleeding, medicines may be given to contract the uterus and stop the bleeding.  °Document Released: 09/19/2008 Document Revised: 04/27/2014 Document Reviewed: 06/01/2013 °ExitCare® Patient Information ©2015 [employer]. This information is not intended to replace advice given to you by your health care provider. Make sure you discuss any questions you have with your  health care provider. ° °

## 2015-09-01 NOTE — MAU Note (Signed)
Report given to Illene Bolus CNM patient given Sprite explained to patient need reactive NST.

## 2015-09-01 NOTE — MAU Note (Addendum)
PT  SAYS SHE STARTED   HAVING    [facility]  OUTBREAK  AT  END OF  AUG  AND  STARTED  [facility]  LAST  WED.  SAYS SHE FEELS  BUMPS  NOW.   NO VE  IN    CLINIC    GBS-  UNSURE       HURT  WITH  [facility]  SINCE 

## 2015-09-01 NOTE — MAU Note (Signed)
Pt presents with contractions, denies bleeding.

## 2015-09-01 NOTE — MAU Note (Signed)
Patient was seen in MAU during the night 1 cm, now having vaginal bleeding concerned about the blood, pain 7/10, contractions are about the same.

## 2015-09-01 NOTE — MAU Provider Note (Signed)
History     CSN: [id number]  Arrival date and time: 09/01/15 4098   First Provider Initiated Contact with Patient 09/01/15 [phone]      No chief complaint on file.  HPI  Patient is 19 y.o. G1P0 [redacted]w[redacted]d here with complaints of contractions.  She reports that she began to feel contractions about 5 hours ago.  [name] Richards also reports vaginal lesions which she believes is genital herpes. 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 She was seen for this as recently as 08/07 and was given acyclovir. No lesions were found on exam that day, but she was treated regardless. The lesions have not improved since then.  She also reports an itchy rash on her shoulders, back, and legs. The rash has been present for about two months.    +FM, denies LOF, VB, vaginal discharge.  Past Medical History  Diagnosis Date  . Medical history non-contributory     Past Surgical History  Procedure Laterality Date  . Excision vaginal cyst      Family History  Problem Relation Age of Onset  . Asthma Father   . Diabetes Maternal Grandmother   . Diabetes Maternal Grandfather     Social History  Substance Use Topics  . Smoking status: Never Smoker   . Smokeless tobacco: None  . Alcohol Use: No    Allergies: No Known Allergies  Prescriptions prior to admission  Medication Sig Dispense Refill Last Dose  . acyclovir (ZOVIRAX) 400 MG tablet Take 400 mg by mouth 5 (five) times daily.   08/31/2015 at Unknown time  . Prenatal Multivit-Min-Fe-FA (PRENATAL VITAMINS) 0.8 MG tablet Take 1 tablet by mouth daily. 30 tablet 12 Past Week at Unknown time  . hydrocortisone cream 0.5 % Apply 1 application topically 2 (two) times daily. (Patient not taking: Reported on 08/01/2015) 30 g 0 Not Taking  . hydrOXYzine (VISTARIL) 25 MG capsule Take 1 capsule (25 mg total) by mouth 3 (three) times daily as needed. (Patient not taking: Reported on 08/23/2015) 30 capsule 0 Not Taking    Review of  Systems  Constitutional: Negative for fever and chills.  Eyes: Negative for blurred vision and double vision.  Respiratory: Negative for shortness of breath.   Cardiovascular: Positive for leg swelling. Negative for chest pain.  Gastrointestinal: Positive for abdominal pain. Negative for nausea and vomiting.  Skin: Positive for itching (endorses itchy bumps in vaginal region, and on arms, shoulders and back) and rash.  Neurological: Negative for dizziness, weakness and headaches.   Physical Exam   Blood pressure 136/88, pulse 119, temperature 98.1 F (36.7 C), temperature source Oral, resp. rate 18, height  (1.549 m), weight 158 lb (71.668 kg), last menstrual period 12/01/2014, SpO2 100 %.  Physical Exam  Nursing note and vitals reviewed. Constitutional: She is oriented to person, place, and time. She appears well-developed and well-nourished. No distress.  HENT:  Head: Normocephalic and atraumatic.  Cardiovascular: Normal rate and regular rhythm.   Respiratory: Effort normal. No respiratory distress.  GI: There is no tenderness.  Gravid  Genitourinary: No vaginal discharge found.  No ulcerations noted; two 1 mm hard papules present on L vulva  Neurological: She is alert and oriented to person, place, and time. No cranial nerve deficit.  Skin: Skin is warm and dry.  Raised hard hyperpigmented papules present on legs,  shoulders and back, some pustular. Excoriations on legs.   Psychiatric: She has a normal mood and affect.  Her behavior is normal.    MAU Course  Procedures None       Assessment and Plan   A: Patient is 19 y.o. G1P0 [redacted]w[redacted]d reporting contractions. Given three cervix checks with no change, will not admit for labor at this time. Papules do not appear herpetic, but will give Vistaril for itching.   P: Discharge home - Follow-up with OB provider at 10 AM this morning  - Recommend biopsy of skin lesions for diagnosis    [name] Richards 09/01/2015, 6:04  AM   Seen and examined by me also Agree with note above.  I inserted the photos to document the rash.   Contractions have been consistent, but 3 cervical exams showed no real change Fetal heart rate has been reactive Dilation: 1 Effacement (%): 60 Cervical Position: Posterior Station: -3 Presentation: Vertex Exam by:: DCALLAWAY, RN  Two papular lesions on perineum do not appear herpetic Lesions there and on back appear similar to molluscum She has an appt this morning Biopsy of papule may aid in diagnosis, since she has no insurance to see Dermatology Labor precautions Given percocet for pain  [name] Richards, CNM

## 2015-09-03 ENCOUNTER — Inpatient Hospital Stay (HOSPITAL_COMMUNITY)
Admission: AD | Admit: 2015-09-03 | Discharge: 2015-09-03 | Disposition: A | Discharge status: Home / Self Care | Payer: Self-pay | Source: Ambulatory Visit | Attending: Obstetrics & Gynecology | Admitting: Obstetrics & Gynecology

## 2015-09-03 DIAGNOSIS — Z3493 Encounter for supervision of normal pregnancy, unspecified, third trimester: Secondary | ICD-10-CM | POA: Insufficient documentation

## 2015-09-03 MED ORDER — HYDROMORPHONE HCL 1 MG/ML IJ SOLN
0.5000 mg | Freq: Once | INTRAMUSCULAR | Status: AC
  Administered 2015-09-03: 0.5 mg via INTRAMUSCULAR
  Filled 2015-09-03: qty 1

## 2015-09-03 MED ORDER — ZOLPIDEM TARTRATE 5 MG PO TABS
5.0000 mg | ORAL_TABLET | Freq: Once | ORAL | Status: AC
  Administered 2015-09-03: 5 mg via ORAL
  Filled 2015-09-03: qty 1

## 2015-09-03 MED ORDER — HYDROXYZINE HCL 25 MG PO TABS
25.0000 mg | ORAL_TABLET | Freq: Once | ORAL | Status: DC
  Filled 2015-09-03: qty 1

## 2015-09-03 NOTE — MAU Note (Signed)
States onset of labor pain begun 0500, denies ROM

## 2015-09-04 ENCOUNTER — Encounter (HOSPITAL_COMMUNITY): Payer: Self-pay

## 2015-09-04 ENCOUNTER — Inpatient Hospital Stay (HOSPITAL_COMMUNITY)
Admission: AD | Admit: 2015-09-04 | Discharge: 2015-09-07 | DRG: 774 | Disposition: A | Discharge status: Home / Self Care | Payer: Medicaid Other | Source: Ambulatory Visit | Attending: Obstetrics & Gynecology | Admitting: Obstetrics & Gynecology

## 2015-09-04 ENCOUNTER — Inpatient Hospital Stay (HOSPITAL_COMMUNITY): Payer: Medicaid Other | Admitting: Anesthesiology

## 2015-09-04 DIAGNOSIS — L299 Pruritus, unspecified: Secondary | ICD-10-CM

## 2015-09-04 DIAGNOSIS — O98319 Other infections with a predominantly sexual mode of transmission complicating pregnancy, unspecified trimester: Secondary | ICD-10-CM

## 2015-09-04 DIAGNOSIS — Z833 Family history of diabetes mellitus: Secondary | ICD-10-CM | POA: Diagnosis not present

## 2015-09-04 DIAGNOSIS — A6009 Herpesviral infection of other urogenital tract: Secondary | ICD-10-CM | POA: Diagnosis present

## 2015-09-04 DIAGNOSIS — O99824 Streptococcus B carrier state complicating childbirth: Secondary | ICD-10-CM | POA: Diagnosis present

## 2015-09-04 DIAGNOSIS — R1031 Right lower quadrant pain: Secondary | ICD-10-CM

## 2015-09-04 DIAGNOSIS — O4290 Premature rupture of membranes, unspecified as to length of time between rupture and onset of labor, unspecified weeks of gestation: Secondary | ICD-10-CM | POA: Diagnosis present

## 2015-09-04 DIAGNOSIS — Z3491 Encounter for supervision of normal pregnancy, unspecified, first trimester: Secondary | ICD-10-CM

## 2015-09-04 DIAGNOSIS — O9832 Other infections with a predominantly sexual mode of transmission complicating childbirth: Secondary | ICD-10-CM | POA: Diagnosis present

## 2015-09-04 DIAGNOSIS — Z3A38 38 weeks gestation of pregnancy: Secondary | ICD-10-CM | POA: Diagnosis present

## 2015-09-04 DIAGNOSIS — O4292 Full-term premature rupture of membranes, unspecified as to length of time between rupture and onset of labor: Principal | ICD-10-CM | POA: Diagnosis present

## 2015-09-04 DIAGNOSIS — O864 Pyrexia of unknown origin following delivery: Secondary | ICD-10-CM | POA: Diagnosis not present

## 2015-09-04 DIAGNOSIS — A6000 Herpesviral infection of urogenital system, unspecified: Secondary | ICD-10-CM | POA: Diagnosis present

## 2015-09-04 DIAGNOSIS — O4210 Premature rupture of membranes, onset of labor more than 24 hours following rupture, unspecified weeks of gestation: Secondary | ICD-10-CM

## 2015-09-04 DIAGNOSIS — O0933 Supervision of pregnancy with insufficient antenatal care, third trimester: Secondary | ICD-10-CM

## 2015-09-04 DIAGNOSIS — O26893 Other specified pregnancy related conditions, third trimester: Secondary | ICD-10-CM | POA: Diagnosis present

## 2015-09-04 LAB — CBC
HEMATOCRIT: 35.1 % — AB (ref 36.0–46.0)
Hemoglobin: 11.4 g/dL — ABNORMAL LOW (ref 12.0–15.0)
MCH: 29.4 pg (ref 26.0–34.0)
MCHC: 32.5 g/dL (ref 30.0–36.0)
MCV: 90.5 fL (ref 78.0–100.0)
PLATELETS: 184 10*3/uL (ref 150–400)
RBC: 3.88 MIL/uL (ref 3.87–5.11)
RDW: 19.2 % — AB (ref 11.5–15.5)
WBC: 12 10*3/uL — ABNORMAL HIGH (ref 4.0–10.5)

## 2015-09-04 LAB — ABO/RH: ABO/RH(D): O POS

## 2015-09-04 LAB — TYPE AND SCREEN
ABO/RH(D): O POS
Antibody Screen: NEGATIVE

## 2015-09-04 LAB — POCT FERN TEST: POCT FERN TEST: POSITIVE

## 2015-09-04 LAB — RPR: RPR: NONREACTIVE

## 2015-09-04 MED ORDER — LIDOCAINE HCL (PF) 1 % IJ SOLN
INTRAMUSCULAR | Status: DC | PRN
  Administered 2015-09-04: 6 mL via EPIDURAL
  Administered 2015-09-04: 4 mL

## 2015-09-04 MED ORDER — OXYTOCIN 40 UNITS IN LACTATED RINGERS INFUSION - SIMPLE MED: 62.5000 mL/h | INTRAVENOUS | Status: DC | PRN

## 2015-09-04 MED ORDER — ACETAMINOPHEN 325 MG PO TABS
650.0000 mg | ORAL_TABLET | ORAL | Status: DC | PRN
  Administered 2015-09-06: 650 mg via ORAL
  Filled 2015-09-04: qty 2

## 2015-09-04 MED ORDER — OXYCODONE-ACETAMINOPHEN 5-325 MG PO TABS
1.0000 | ORAL_TABLET | ORAL | Status: DC | PRN
  Administered 2015-09-05 – 2015-09-07 (×7): 1 via ORAL
  Filled 2015-09-04 (×7): qty 1

## 2015-09-04 MED ORDER — DEXTROSE 5 % IV SOLN
5.0000 10*6.[IU] | Freq: Once | INTRAVENOUS | Status: AC
  Administered 2015-09-04: 5 10*6.[IU] via INTRAVENOUS
  Filled 2015-09-04: qty 5

## 2015-09-04 MED ORDER — ACETAMINOPHEN 325 MG PO TABS: 650.0000 mg | ORAL_TABLET | ORAL | Status: DC | PRN

## 2015-09-04 MED ORDER — FENTANYL CITRATE (PF) 100 MCG/2ML IJ SOLN
100.0000 ug | INTRAMUSCULAR | Status: DC | PRN
  Administered 2015-09-04: 100 ug via INTRAVENOUS

## 2015-09-04 MED ORDER — EPHEDRINE 5 MG/ML INJ
10.0000 mg | INTRAVENOUS | Status: DC | PRN
  Filled 2015-09-04: qty 2

## 2015-09-04 MED ORDER — IBUPROFEN 600 MG PO TABS
600.0000 mg | ORAL_TABLET | Freq: Four times a day (QID) | ORAL | Status: DC
  Administered 2015-09-04 – 2015-09-07 (×11): 600 mg via ORAL
  Filled 2015-09-04 (×11): qty 1

## 2015-09-04 MED ORDER — LANOLIN HYDROUS EX OINT: TOPICAL_OINTMENT | CUTANEOUS | Status: DC | PRN

## 2015-09-04 MED ORDER — FENTANYL 2.5 MCG/ML BUPIVACAINE 1/10 % EPIDURAL INFUSION (WH - ANES)
14.0000 mL/h | INTRAMUSCULAR | Status: DC | PRN
  Administered 2015-09-04 (×2): 14 mL/h via EPIDURAL
  Filled 2015-09-04 (×2): qty 125

## 2015-09-04 MED ORDER — OXYTOCIN 40 UNITS IN LACTATED RINGERS INFUSION - SIMPLE MED
1.0000 m[IU]/min | INTRAVENOUS | Status: DC
  Administered 2015-09-04: 2 m[IU]/min via INTRAVENOUS

## 2015-09-04 MED ORDER — CITRIC ACID-SODIUM CITRATE 334-500 MG/5ML PO SOLN
30.0000 mL | ORAL | Status: DC | PRN
  Administered 2015-09-04: 30 mL via ORAL
  Filled 2015-09-04: qty 15

## 2015-09-04 MED ORDER — OXYTOCIN 40 UNITS IN LACTATED RINGERS INFUSION - SIMPLE MED: 62.5000 mL/h | INTRAVENOUS | Status: DC

## 2015-09-04 MED ORDER — OXYCODONE-ACETAMINOPHEN 5-325 MG PO TABS: 1.0000 | ORAL_TABLET | ORAL | Status: DC | PRN

## 2015-09-04 MED ORDER — LACTATED RINGERS IV SOLN
INTRAVENOUS | Status: DC
  Administered 2015-09-04 (×2): via INTRAVENOUS

## 2015-09-04 MED ORDER — LACTATED RINGERS IV SOLN
500.0000 mL | INTRAVENOUS | Status: DC | PRN
Start: 2015-09-04 — End: 2015-09-04

## 2015-09-04 MED ORDER — LIDOCAINE HCL (PF) 1 % IJ SOLN
30.0000 mL | INTRAMUSCULAR | Status: DC | PRN
  Filled 2015-09-04: qty 30

## 2015-09-04 MED ORDER — BENZOCAINE-MENTHOL 20-0.5 % EX AERO
1.0000 "application " | INHALATION_SPRAY | CUTANEOUS | Status: DC | PRN
  Administered 2015-09-04 – 2015-09-07 (×2): 1 via TOPICAL
  Filled 2015-09-04 (×2): qty 56

## 2015-09-04 MED ORDER — PRENATAL MULTIVITAMIN CH
1.0000 | ORAL_TABLET | Freq: Every day | ORAL | Status: DC
  Administered 2015-09-05 – 2015-09-07 (×3): 1 via ORAL
  Filled 2015-09-04 (×3): qty 1

## 2015-09-04 MED ORDER — PHENYLEPHRINE 40 MCG/ML (10ML) SYRINGE FOR IV PUSH (FOR BLOOD PRESSURE SUPPORT)
80.0000 ug | PREFILLED_SYRINGE | INTRAVENOUS | Status: DC | PRN
  Filled 2015-09-04: qty 2
  Filled 2015-09-04: qty 20

## 2015-09-04 MED ORDER — INFLUENZA VAC SPLIT QUAD 0.5 ML IM SUSY
0.5000 mL | PREFILLED_SYRINGE | INTRAMUSCULAR | Status: AC
  Administered 2015-09-05: 0.5 mL via INTRAMUSCULAR
  Filled 2015-09-04: qty 0.5

## 2015-09-04 MED ORDER — DIPHENHYDRAMINE HCL 50 MG/ML IJ SOLN
12.5000 mg | INTRAMUSCULAR | Status: DC | PRN
  Administered 2015-09-04: 12.5 mg via INTRAVENOUS
  Filled 2015-09-04: qty 1

## 2015-09-04 MED ORDER — DOCUSATE SODIUM 100 MG PO CAPS
100.0000 mg | ORAL_CAPSULE | Freq: Two times a day (BID) | ORAL | Status: DC
  Administered 2015-09-04 – 2015-09-07 (×6): 100 mg via ORAL
  Filled 2015-09-04 (×6): qty 1

## 2015-09-04 MED ORDER — WITCH HAZEL-GLYCERIN EX PADS
1.0000 | MEDICATED_PAD | CUTANEOUS | Status: DC | PRN
Start: 2015-09-04 — End: 2015-09-07

## 2015-09-04 MED ORDER — ONDANSETRON HCL 4 MG/2ML IJ SOLN: 4.0000 mg | Freq: Four times a day (QID) | INTRAMUSCULAR | Status: DC | PRN

## 2015-09-04 MED ORDER — FENTANYL 2.5 MCG/ML BUPIVACAINE 1/10 % EPIDURAL INFUSION (WH - ANES): 14.0000 mL/h | INTRAMUSCULAR | Status: DC | PRN

## 2015-09-04 MED ORDER — ONDANSETRON HCL 4 MG/2ML IJ SOLN: 4.0000 mg | INTRAMUSCULAR | Status: DC | PRN

## 2015-09-04 MED ORDER — DIPHENHYDRAMINE HCL 25 MG PO CAPS
25.0000 mg | ORAL_CAPSULE | Freq: Four times a day (QID) | ORAL | Status: DC | PRN
Start: 2015-09-04 — End: 2015-09-07

## 2015-09-04 MED ORDER — ONDANSETRON HCL 4 MG PO TABS: 4.0000 mg | ORAL_TABLET | ORAL | Status: DC | PRN

## 2015-09-04 MED ORDER — OXYCODONE-ACETAMINOPHEN 5-325 MG PO TABS: 2.0000 | ORAL_TABLET | ORAL | Status: DC | PRN

## 2015-09-04 MED ORDER — OXYTOCIN BOLUS FROM INFUSION: 500.0000 mL | INTRAVENOUS | Status: DC

## 2015-09-04 MED ORDER — SIMETHICONE 80 MG PO CHEW: 80.0000 mg | CHEWABLE_TABLET | ORAL | Status: DC | PRN

## 2015-09-04 MED ORDER — PENICILLIN G POTASSIUM 5000000 UNITS IJ SOLR
2.5000 10*6.[IU] | INTRAVENOUS | Status: DC
  Administered 2015-09-04 (×2): 2.5 10*6.[IU] via INTRAVENOUS
  Filled 2015-09-04 (×6): qty 2.5

## 2015-09-04 MED ORDER — DIBUCAINE 1 % RE OINT: 1.0000 "application " | TOPICAL_OINTMENT | RECTAL | Status: DC | PRN

## 2015-09-04 NOTE — Progress Notes (Signed)
Called by RN for temp of 38.2.  On recheck temp was 99.0. Patient is tachycardic to 110s. She does not have fundal tenderness.    Concern for choriomnionitis but temp has resolved without use of anti-pyretics. Will continue to monitor if additional temperature, develops fundal tenderness or worsening tachycardia plan to start antibiotics.   Federico Flake, MD

## 2015-09-04 NOTE — H&P (Signed)
[name] Richards is a 19 y.o. female G1 @ 38.3wks by 8wk scan presenting for leaking fluid recently. Is also having ctx; no bldg. Denies H/A, N/V or fever. Her preg has been followed by the [facility] and has been remarkable for 1) onset of care @ 12wks but then lapse until 33wks 2) GBS pos 3) urticaric papular lesions on back/perineum (needs bx PP). She was given  History OB History    Gravida Para Term Preterm AB TAB SAB Ectopic Multiple Living   1              Past Medical History  Diagnosis Date  . Medical history non-contributory    Past Surgical History  Procedure Laterality Date  . Excision vaginal cyst     Family History: family history includes Asthma in her father; Diabetes in her maternal grandfather and maternal grandmother. Social History:  reports that she has never smoked. She has never used smokeless tobacco. She reports that she does not drink alcohol or use illicit drugs.   Prenatal Transfer Tool  Maternal Diabetes: No Genetic Screening: Declined Maternal Ultrasounds/Referrals: Normal Fetal Ultrasounds or other Referrals:  None Maternal Substance Abuse:  No Significant Maternal Medications:  Meds include: Other:  Vistaril Significant Maternal Lab Results:  Lab values include: Group B Strep positive Other Comments:  None- spotty PNC, none b/t 12-33wks  ROS  Dilation: 3 Effacement (%): 100 Station: -2 Exam by:: A[employer]ison RN Blood pressure 144/91, pulse 107, temperature 98.3 F (36.8 C), temperature source Oral, resp. rate 20, last menstrual period 12/01/2014. Exam Physical Exam  Constitutional: She is oriented to person, place, and time. She appears well-developed.  HENT:  Head: Normocephalic.  Neck: Normal range of motion.  Cardiovascular: Normal rate.   Respiratory: Effort normal.  GI:  EFM 140s, +accels, no decels Ctx q 3-5 mins  Genitourinary:  Fern slide +  Musculoskeletal: Normal range of motion.  Neurological: She is alert and oriented to person,  place, and time.  Skin: Skin is warm and dry.  Psychiatric: She has a normal mood and affect. Her behavior is normal. Thought content normal.    Prenatal labs: ABO, Rh: --/--/O POS, O POS (08/29 1803) Antibody: NEG (08/29 1803) Rubella: 3.11 (03/09 1305) RPR: NON REAC (08/03 1521)  HBsAg: NEGATIVE (03/09 1305)  HIV: NONREACTIVE (08/03 1521)  GBS:   Pos 8/31  Assessment/Plan: IUP@38 .3wks SROM Early labor GBS pos  Admit to [facility] Expectant management PCN for GBS ppx Anticipate SVD Initial BP elevated- will watch on L&D   Cam Hai CNM 09/04/2015, 3:41 AM

## 2015-09-04 NOTE — Progress Notes (Signed)
Anesthesia MD notified that pt is complaining about pain in upper back between neck and right shoulder blade. MD will assess post delivery after epidural has been off for a while if pt still uncomfortable. RN will continue to monitor

## 2015-09-04 NOTE — Anesthesia Preprocedure Evaluation (Signed)

## 2015-09-04 NOTE — Progress Notes (Signed)
Anesthesia MD notified that patients complaint of pain between neck and right shoulder blade completely resolved post delivery. Okay for RN to remove epidural.

## 2015-09-04 NOTE — MAU Note (Signed)
Shorts wet on way here , ? Leaking.  Contractions strong. No bleeding. Baby moving well.

## 2015-09-04 NOTE — Anesthesia Procedure Notes (Signed)
Epidural Patient location during procedure: OB  Preanesthetic Checklist Completed: patient identified, site marked, surgical consent, pre-op evaluation, timeout performed, IV checked, risks and benefits discussed and monitors and equipment checked  Epidural Patient position: sitting Prep: site prepped and draped and DuraPrep Patient monitoring: continuous pulse ox and blood pressure Approach: midline Injection technique: LOR air  Needle:  Needle type: Tuohy  Needle gauge: 17 G Needle length: 9 cm and 9 Needle insertion depth: 6 cm Catheter type: closed end flexible Catheter size: 19 Gauge Catheter at skin depth: 12 cm Test dose: negative  Assessment Events: blood not aspirated, injection not painful, no injection resistance, negative IV test and no paresthesia  Additional Notes Dosing of Epidural:  1st dose, through catheter .............................................  Xylocaine 40 mg  2nd dose, through catheter, after waiting 3 minutes.........Xylocaine 60 mg    As each dose occurred, patient was free of IV sx; and patient exhibited no evidence of SA injection.  Patient is more comfortable after epidural dosed. Please see RN's note for documentation of vital signs,and FHR which are stable.  Patient reminded not to try to ambulate with numb legs, and that an RN must be present when she attempts to get up.        

## 2015-09-04 NOTE — Progress Notes (Signed)
Labor Progress Note  S: s/p epidural. Feeling comfortable. She is worried about herpes  O:  BP 127/79 mmHg  Pulse 108  Temp(Src) 98.2 F (36.8 C) (Oral)  Resp 18  Ht  (1.549 m)  Wt 158 lb (71.668 kg)  BMI 29.87 kg/m2  SpO2 99%  LMP 12/01/2014  NST:150/mod/+accels/no decels SSE: no lesions on external genitalia or internally. No ulcerated areas. Dilation: 4 Effacement (%): 70 Cervical Position: Posterior Station: -2 Presentation: Vertex Exam by:: Dr. Alvester Morin AROM of forebag- moderate meconium.  CVE- after AROM 5/70/-2  A&P: 19 y.o. G1P0 [redacted]w[redacted]d here with PROM #Labor: Start augmentation with pitocin #FWB: Cat I #GBS Pos- PCN #vaginal lesion- not herpetic. Poss molluscum.   Federico Flake, MD 10:31 AM

## 2015-09-05 NOTE — Anesthesia Postprocedure Evaluation (Signed)
  Anesthesia Post-op Note  Patient: [name] Richards  Procedure(s) Performed: * No procedures listed *  Patient Location: Mother/Baby  Anesthesia Type:Epidural  Level of Consciousness: awake  Airway and Oxygen Therapy: Patient Spontanous Breathing  Post-op Pain: mild  Post-op Assessment: Patient's Cardiovascular Status Stable and Respiratory Function Stable              Post-op Vital Signs: stable  Last Vitals:  Filed Vitals:   09/05/15 0651  BP: 116/69  Pulse: 79  Temp: 36.5 C  Resp: 17    Complications: No apparent anesthesia complications

## 2015-09-05 NOTE — Lactation Note (Signed)
This note was copied from the chart of [name] Ashauna Teall. Lactation Consultation Note  Follow-up visit. Pt was set up with a size 20 nipple shield with nurse prior to [facility] visit and would stay latched for a little but then falls asleep. LC set mom up with DEBP for stimulation & supplementation. Mom reported that the initial setting was painful so decreased pressure to comfort. Infant was showing feeding cues so changed stool diaper and stopped DEBP (mom got drops). Tried latching in football on left breast with 20 nipple shield, it appeared a little big so tried the 16mm nipple shield but mom reported that she felt as though infant was pinching mom with this size so went back to 20mm nipple shield. Infant too sleepy to suck once latched. Encouraged mom to eat lunch, pump for 10-15 minutes and then call LC.    Patient Name: [name] Richards ZOXWR'U Date: 09/05/2015 Reason for consult: Follow-up assessment   Maternal Data Has patient been taught Hand Expression?: Yes Does the patient have breastfeeding experience prior to this delivery?: No  Feeding Feeding Type: Breast Fed  LATCH Score/Interventions Latch: Too sleepy or reluctant, no latch achieved, no sucking elicited. Intervention(s): Skin to skin;Teach feeding cues;Waking techniques  Audible Swallowing: None Intervention(s): Skin to skin;Hand expression  Type of Nipple: Everted at rest and after stimulation Intervention(s): Double electric pump;Shells  Comfort (Breast/Nipple): Soft / non-tender     Hold (Positioning): Assistance needed to correctly position infant at breast and maintain latch. Intervention(s): Support Pillows;Breastfeeding basics reviewed;Position options;Skin to skin  LATCH Score: 5  Lactation Tools Discussed/Used Tools: Shells;Nipple Dorris Carnes;Pump Nipple shield size: 20;16 Shell Type: Other (comment) (short nipple) Breast pump type: Manual WIC Program: No Pump Review: Setup, frequency, and cleaning;Milk  Storage Initiated by:: Blondell Reveal, RD, IBCLC Date initiated:: 09/05/15   Consult Status Consult Status: Follow-up Date: 09/05/15 Follow-up type: In-patient    Oneal Grout 09/05/2015, 12:57 PM

## 2015-09-05 NOTE — Lactation Note (Signed)
This note was copied from the chart of [name] Richards. Lactation Consultation Note  Initial assessment-nurse tech asked about using a nipple shield for mom due to flat nipples. Discussed importance of trying shells & hand pump prior to starting a nipple shield. Provided & showed mom how to use hand pump to evert nipples. Nipples did evert but has short shaft nipples. Mom demonstrated hand expression with both hands and LC demonstrated one-hand technique. Mom able to express drops of milk onto spoon, which were given to infant. Unable to arouse infant enough to be interested in latching. Tried latching infant in football hold on left breast and cross-cradle on left breast. Infant did open mouth wide but did not suck and feel back asleep. Provided & showed mom how to use shells for inverted nipples (mom does not have bra currently but family member may bring one). Left mom STS and encouraged mom to call lactation when baby shows feeding cues for help with latch.  Patient Name: [name] Richards ZOXWR'U Date: 09/05/2015 Reason for consult: Initial assessment   Maternal Data Has patient been taught Hand Expression?: Yes Does the patient have breastfeeding experience prior to this delivery?: No  Feeding    LATCH Score/Interventions Latch: Too sleepy or reluctant, no latch achieved, no sucking elicited. Intervention(s): Waking techniques;Skin to skin  Audible Swallowing: None Intervention(s): Skin to skin;Hand expression  Type of Nipple: Flat (short shaft) Intervention(s): Shells;Hand pump  Comfort (Breast/Nipple): Soft / non-tender     Hold (Positioning): Assistance needed to correctly position infant at breast and maintain latch. Intervention(s): Support Pillows;Breastfeeding basics reviewed;Position options;Skin to skin  LATCH Score: 4  Lactation Tools Discussed/Used Tools: Shells;Pump Shell Type: Inverted Breast pump type: Manual   Consult Status Consult Status: Follow-up Date:  09/05/15 Follow-up type: In-patient    Oneal Grout 09/05/2015, 10:40 AM

## 2015-09-05 NOTE — Progress Notes (Signed)
POSTPARTUM PROGRESS NOTE Post Partum Day 1 Subjective:  [name] Richards is a 19 y.o. G1P1001 [redacted]w[redacted]d s/p SVD at 1500 on 09/04/15.  No acute events overnight.  Pt denies problems with ambulating, voiding or po intake.  She denies nausea or vomiting.  Pain is well controlled. She has had bowel movement.  Lochia Moderate.  Plan for birth control is undecided- discussed and will continue to address.  Method of Feeding: breast.  Objective: Blood pressure 109/51, pulse 103, temperature 99 F (37.2 C), temperature source Oral, resp. rate 16, height  (1.549 m), weight 158 lb (71.668 kg), last menstrual period 12/01/2014, SpO2 100 %, unknown if currently breastfeeding.  Physical Exam:  General: alert, cooperative and no distress Chest: normal WOB Heart: Regular rate Abdomen: +BS, soft, nontender Uterine Fundus: firm DVT Evaluation: No evidence of DVT seen on physical exam. Extremities: no edema   Recent Labs  09/06/15 1458 09/07/15 0745  HGB 9.7* 9.7*  HCT 29.9* 30.7*    Assessment/Plan:  ASSESSMENT: [name] Richards is a 19 y.o. G1P1001 [redacted]w[redacted]d s/p SVD at 1500 on 09/04/15.   She does not desire inpatient circumcision for her baby. She is currently undecided on desired contraceptive method after discharge, but plans to decide this morning.  Continue pp care Plan for discharge tomorrow.   LOS: 3 days   Federico Flake 09/07/2015, 11:22 AM

## 2015-09-05 NOTE — Addendum Note (Signed)
Addendum  created 09/05/15 1505 by Renford Dills, CRNA   Modules edited: Charges VN, Notes Section   Notes Section:  File: [id number]

## 2015-09-05 NOTE — Lactation Note (Addendum)
This note was copied from the chart of [name] Richards. Lactation Consultation Note  Mother recently pumped and received drops of colostrum.  Baby has been sleepy and not successfully latched.  Baby cueing upon entering the room and offered to help mother w/ latching. Mother seemed discouraged on the verge of tears and stated she did not want to try breastfeeding. Spoke with Dorene Grebe RN who had recently told mother she needed to give baby formula since she was concerned about baby not feeding. 3 stools and 2 voids in first 24 hours.     Patient Name: [name] Marissia Blackham [id number] Date: 09/05/2015 Reason for consult: Follow-up assessment   Maternal Data    Feeding Feeding Type: Breast Fed Length of feed:  (few sucks, very sleepy)  LATCH Score/Interventions Latch: Too sleepy or reluctant, no latch achieved, no sucking elicited. Intervention(s): Skin to skin;Teach feeding cues;Waking techniques  Audible Swallowing: None Intervention(s): Skin to skin;Hand expression  Type of Nipple: Everted at rest and after stimulation Intervention(s): Double electric pump;Shells  Comfort (Breast/Nipple): Soft / non-tender     Hold (Positioning): Assistance needed to correctly position infant at breast and maintain latch.  LATCH Score: 5  Lactation Tools Discussed/Used Tools: Shells;Nipple Dorris Carnes;Pump Nipple shield size: 20;16 Shell Type: Other (comment) (short nipple) WIC Program: No Pump Review: Setup, frequency, and cleaning;Milk Storage Initiated by:: Blondell Reveal, RD, IBCLC Date initiated:: 09/05/15   Consult Status Consult Status: Follow-up Date: 09/05/15 Follow-up type: In-patient    Dahlia Byes [facility] 09/05/2015, 3:23 PM

## 2015-09-06 ENCOUNTER — Inpatient Hospital Stay (HOSPITAL_COMMUNITY): Payer: Medicaid Other

## 2015-09-06 LAB — CBC WITH DIFFERENTIAL/PLATELET
BASOS ABS: 0 10*3/uL (ref 0.0–0.1)
BASOS PCT: 0 % (ref 0–1)
Eosinophils Absolute: 0.2 10*3/uL (ref 0.0–0.7)
Eosinophils Relative: 1 % (ref 0–5)
HEMATOCRIT: 29.9 % — AB (ref 36.0–46.0)
HEMOGLOBIN: 9.7 g/dL — AB (ref 12.0–15.0)
Lymphocytes Relative: 6 % — ABNORMAL LOW (ref 12–46)
Lymphs Abs: 0.8 10*3/uL (ref 0.7–4.0)
MCH: 29.3 pg (ref 26.0–34.0)
MCHC: 32.4 g/dL (ref 30.0–36.0)
MCV: 90.3 fL (ref 78.0–100.0)
Monocytes Absolute: 0.7 10*3/uL (ref 0.1–1.0)
Monocytes Relative: 6 % (ref 3–12)
NEUTROS ABS: 11.1 10*3/uL — AB (ref 1.7–7.7)
NEUTROS PCT: 87 % — AB (ref 43–77)
Platelets: 156 10*3/uL (ref 150–400)
RBC: 3.31 MIL/uL — ABNORMAL LOW (ref 3.87–5.11)
RDW: 19.2 % — ABNORMAL HIGH (ref 11.5–15.5)
WBC: 12.7 10*3/uL — AB (ref 4.0–10.5)

## 2015-09-06 LAB — URINALYSIS, ROUTINE W REFLEX MICROSCOPIC
Bilirubin Urine: NEGATIVE
Glucose, UA: NEGATIVE mg/dL
KETONES UR: NEGATIVE mg/dL
NITRITE: NEGATIVE
PROTEIN: NEGATIVE mg/dL
Specific Gravity, Urine: 1.01 (ref 1.005–1.030)
Urobilinogen, UA: 0.2 mg/dL (ref 0.0–1.0)
pH: 6 (ref 5.0–8.0)

## 2015-09-06 LAB — URINE MICROSCOPIC-ADD ON

## 2015-09-06 MED ORDER — CYCLOBENZAPRINE HCL 10 MG PO TABS: 10.0000 mg | ORAL_TABLET | Freq: Three times a day (TID) | ORAL | Status: DC | PRN

## 2015-09-06 MED ORDER — BISACODYL 10 MG RE SUPP
10.0000 mg | Freq: Once | RECTAL | Status: DC
  Filled 2015-09-06: qty 1

## 2015-09-06 MED ORDER — CYCLOBENZAPRINE HCL 10 MG PO TABS
10.0000 mg | ORAL_TABLET | Freq: Three times a day (TID) | ORAL | Status: DC | PRN
  Filled 2015-09-06: qty 1

## 2015-09-06 MED ORDER — AMOXICILLIN-POT CLAVULANATE 875-125 MG PO TABS
1.0000 | ORAL_TABLET | Freq: Two times a day (BID) | ORAL | Status: DC
  Administered 2015-09-06 – 2015-09-07 (×2): 1 via ORAL
  Filled 2015-09-06 (×2): qty 1

## 2015-09-06 MED ORDER — DOCUSATE SODIUM 100 MG PO CAPS: 100.0000 mg | ORAL_CAPSULE | Freq: Two times a day (BID) | ORAL | Status: DC

## 2015-09-06 MED ORDER — IBUPROFEN 600 MG PO TABS: 600.0000 mg | ORAL_TABLET | Freq: Four times a day (QID) | ORAL | Status: DC

## 2015-09-06 MED ORDER — OXYCODONE-ACETAMINOPHEN 5-325 MG PO TABS: 1.0000 | ORAL_TABLET | ORAL | Status: DC | PRN

## 2015-09-06 MED ORDER — IOHEXOL 300 MG/ML  SOLN
100.0000 mL | Freq: Once | INTRAMUSCULAR | Status: AC | PRN
  Administered 2015-09-06: 100 mL via INTRAVENOUS

## 2015-09-06 NOTE — Lactation Note (Signed)
This note was copied from the chart of [name] Pamella Flater. Lactation Consultation Note' Mom has given formula by bottle the last few feedings Reports breast feeding is hard. Offered assist with latch, Mom agreeable Reports breasts are feeling heavier this morning. Baby would not latch to bare breast. Used #20 NS and mom reports she feels tugging, no pain. Nursed for 10 min on right breast in cross cradle hold. Nursed on the left breast for 25 min. Assisted mom with using curved tip syringe and placing formula in NS if baby too fussy. Came off still fussy- bottle fed 5 cc's formula baby then calm. Few drops of blood noted in NS when he came off breast. Encouraged to keep him close to the breast throughout the feeding. Mom reports breast feels softer after nursing. Mom pleased breast feeding had gone so well. No questions at present. Reviewed our phone number to call with questions/concerns.   Patient Name: [name] Richards ZOXWR'U Date: 09/06/2015 Reason for consult: Follow-up assessment   Maternal Data Formula Feeding for Exclusion: No Has patient been taught Hand Expression?: Yes Does the patient have breastfeeding experience prior to this delivery?: No  Feeding Feeding Type: Breast Fed Nipple Type: Slow - flow Length of feed: 35 min  LATCH Score/Interventions Latch: Grasps breast easily, tongue down, lips flanged, rhythmical sucking. (with NS)  Audible Swallowing: A few with stimulation  Type of Nipple: Flat  Comfort (Breast/Nipple): Soft / non-tender     Hold (Positioning): Assistance needed to correctly position infant at breast and maintain latch. Intervention(s): Breastfeeding basics reviewed;Position options  LATCH Score: 7  Lactation Tools Discussed/Used Tools: Nipple Dorris Carnes;Shells Nipple shield size: 20 Shell Type: Inverted Breast pump type: Manual WIC Program: No Pump Review: Setup, frequency, and cleaning   Consult Status Consult Status: Complete    Pamelia Hoit 09/06/2015, 9:06 AM

## 2015-09-06 NOTE — Progress Notes (Signed)
S: Went to check on pt due to fever earlier in day and reported 6/10 pain per nursing. Per pt, pain is cramping and in low back and lower abdomen. She has moderate lochia, no foul discharge. Has dysuria currently. No aches or other pains. Pain now only a 3/10 with heating pad. Per nursing, particulate meconium in amniotic fluid and newborn currently in NICU for infection.  OCeasar Mons Vitals:   09/06/15 2045  BP:   Pulse:   Temp: 98.8 F (37.1 C)  Resp:   General: WDWN in NAD. CV: RRR. No murmurs, gallops, rubs Resp: CTAB. No increased WOB. No wheezing, rales, or rhonchi Abd: Uterine tenderness above normal expected postpartum. RLQ and LLQ tenderness. Back: No CVA tenderness Ext: No LE edema  A/P: 1. Maternal Postpartum Fever, PPD#2: fever as high as 101.9. Differential includes endometritis vs pyelo vs UTI -CT obtained with no signs of intrabdominal infection. No perinephric stranding or hydronephrosis. No appendicitis evident on CT. -CBC showed slight leukocytosis with small left shift -Will treat empirically for endometritis/pyelo/UTI with augmentin for now -Continue to monitor for fever

## 2015-09-06 NOTE — Progress Notes (Signed)
Patient ID: [name] Richards, female   DOB: 1996-01-12, 19 y.o.   MRN: [id number] Results reviewed Filed Vitals:   09/06/15 0656 09/06/15 1236 09/06/15 1300 09/06/15 1430  BP:   130/83   Pulse:      Temp: 98.5 F (36.9 C) 99.2 F (37.3 C) 100.2 F (37.9 C) 101.9 F (38.8 C)  TempSrc: Oral Oral Oral Oral  Resp:      Height:      Weight:      SpO2:       WBC slightly elevated but there is a Left Shift  UA mostly clear  Discussed with Dr Adrian Blackwater  Will order CT to rule out appendicitis

## 2015-09-06 NOTE — Progress Notes (Signed)
Called to evaluate back pain on the floor post epidural placement. Patient has been ambulating with no difficulties. Denies any fever, numbness, weakness, bowel or bladder incontinence or sharp back pain. She describes lower right flank pain that is "achy" in nature. When examining the patient, no motor or sensory deficits appreciated. The site of the epidural insertion was clean, dry, no erythema, edema, pus and nontender to palpation. It was actually difficult to even find the spot the epidural was inserted. I explained concerning symptoms to the patient to return to the hospital for and please feel free to call anesthesia at any time to reexamine. She did complain of some right leg swelling of which the nurse in the room told me that the [facility] team was aware and I also relayed this information to Dr. Adrian Blackwater.

## 2015-09-06 NOTE — Clinical Social Work Maternal (Signed)
  CLINICAL SOCIAL WORK MATERNAL/CHILD NOTE  Patient Details  Name: [name] Richards MRN: [id number] Date of Birth: 12/18/1996  Date:  09/06/2015  Clinical Social Worker Initiating Note:  Lucita Ferrara, LCSW Date/ Time Initiated:  09/06/15/1030     Child's Name:  [name] Richards   Legal Guardian:  Mother   Need for Interpreter:  None   Date of Referral:  09/06/15     Reason for Referral:  Limited Prenatal Care    Referral Source:  [facility]   Address:  [place], [place] [address]  Phone number:  [phone]   Household Members:  Significant Other   Natural Supports (not living in the home):  Extended Family, Immediate Family   Professional Supports: None   Employment: Homemaker   Type of Work:   N/A  Education:    N/A  Pensions consultant:  Self-Pay    Other Resources:  [facility]   Cultural/Religious Considerations Which May Impact Care:  None reported  Strengths:  Home prepared for child , Pediatrician chosen , Ability to meet basic needs    Risk Factors/Current Problems:  None   Cognitive State:  Able to Concentrate , Alert , Goal Oriented , Linear Thinking    Mood/Affect:  Comfortable , Calm , Euthymic    CSW Assessment:  CSW received request for consult due to lapse/limited prenatal care.  MOB initiated care at 12 weeks, but then did not attend appointment until 33 weeks.  FOB was noted to be resting when CSW entered the room.  MOB presented as easily engaged and receptive to the visit. She displayed a full range in affect, was in a pleasant mood, and smiled when she interacted with the infant.  MOB denied questions, concerns, or needs as she prepares to transition home, and processed the normative range in emotions that accompany role transition to motherhood. She shared that she is nervous, but feels prepared since she has the support from her mother and the FOB's mother. MOB recognizes that she she has the support outside of the hospital as she  continues to learn how to care for an infant. MOB reported that the home is prepared for the infant, and that all basic needs are met. MOB denied history of mental health diagnoses, and denied mental health symptoms during the pregnancy. MOB presented as attentive and engaged during education on perinatal mood and anxiety disorders, and agreed to contact her medical provider if needs arise.   CSW inquired about barriers to accessing care during the pregnancy.  MOB stated that she had been living in [place] with her mother, then moved to [place] with FOB. She stated that she moved between her first appointment and her second appointment, and was unable to access care during that time period.  She stated that she no longer has any barriers to accessing care, and reported that she has access to transportation for all follow up appointment.  MOB verbalized understanding of the hospital drug screen, and denied all substance use during the pregnancy.   CSW Plan/Description:   1)Patient/Family Education: Hospital drug screen policy, perinatal mood and anxiety disorders  2) CSW to monitor infant's toxicology screens, will notify CPS if positive.  3)No Further Intervention Required/No Barriers to Discharge    Sharyl Nimrod 09/06/2015, 12:32 PM

## 2015-09-06 NOTE — Progress Notes (Signed)
Patient ID: [name] Richards, female   DOB: August 03, 1996, 19 y.o.   MRN: [id number] Called to see patient re: fever and abdominal pain  Filed Vitals:   09/06/15 0656 09/06/15 1236 09/06/15 1300 09/06/15 1430  BP:   130/83   Pulse:      Temp: 98.5 F (36.9 C) 99.2 F (37.3 C) 100.2 F (37.9 C) 101.9 F (38.8 C)  TempSrc: Oral Oral Oral Oral  Resp:      Height:      Weight:      SpO2:       Patient complains of abdominal and right flank pain, worse than before  HR RRR, tachycardic Lungs clear  Abdomen tender over umbilicus and RLQ No guarding + rebound  Cath UA ordered CBC + diff ordered  Consulted Dr Adrian Blackwater:  Will wait for these results.

## 2015-09-06 NOTE — Addendum Note (Signed)
Addendum  created 09/06/15 1040 by Karie Schwalbe, MD   Modules edited: Clinical Notes   Clinical Notes:  File: [id number]; Pend: [id number]

## 2015-09-06 NOTE — Progress Notes (Signed)
Pt. Stated she felt like she was getting a fever, and having chills.  Temp 100.2; MD notified, orders received.  Will continue to monitor.  Vivi Martens RN

## 2015-09-06 NOTE — Discharge Instructions (Signed)

## 2015-09-06 NOTE — Discharge Summary (Signed)
Obstetric Discharge Summary Reason for Admission: onset of labor Prenatal Procedures: ultrasound Intrapartum Procedures: spontaneous vaginal delivery Postpartum Procedures: none Complications-Operative and Postpartum: none HEMOGLOBIN  Date Value Ref Range Status  09/04/2015 11.4* 12.0 - 15.0 g/dL Final   HGB  Date Value Ref Range Status  10/13/2013 13.3 12.0-16.0 g/dL Final   HCT  Date Value Ref Range Status  09/04/2015 35.1* 36.0 - 46.0 % Final  10/13/2013 39.2 35.0-47.0 % Final   S: pt c/o mod back pain in center of back where she had epidural. Physical Exam:  General: alert, cooperative, appears stated age and no distress Lochia: appropriate Uterine Fundus: firm Incision: healing well, no significant drainage DVT Evaluation: No evidence of DVT seen on physical exam. Negative Homan's sign. No cords or calf tenderness.  Discharge Diagnoses: Term Pregnancy-delivered  Discharge Information: Date: 09/06/2015 Activity: pelvic rest Diet: routine Medications: PNV, Ibuprofen and Percocet Condition: stable and improved Instructions: refer to practice specific booklet Discharge to: home, will start on flexiril   Newborn Data: Live born female  Birth Weight: 6 lb 10.4 oz (3015 g) APGAR: 9, 9  Home with mother.  [name] Richards DARLENE 09/06/2015, 7:02 AM

## 2015-09-06 NOTE — Progress Notes (Signed)
UR chart review completed.  

## 2015-09-07 ENCOUNTER — Ambulatory Visit: Payer: Self-pay

## 2015-09-07 ENCOUNTER — Encounter: Payer: Self-pay | Admitting: Advanced Practice Midwife

## 2015-09-07 LAB — CBC WITH DIFFERENTIAL/PLATELET
Basophils Absolute: 0 10*3/uL (ref 0.0–0.1)
Basophils Relative: 0 % (ref 0–1)
EOS ABS: 0.2 10*3/uL (ref 0.0–0.7)
EOS PCT: 2 % (ref 0–5)
HCT: 30.7 % — ABNORMAL LOW (ref 36.0–46.0)
Hemoglobin: 9.7 g/dL — ABNORMAL LOW (ref 12.0–15.0)
LYMPHS ABS: 1.3 10*3/uL (ref 0.7–4.0)
Lymphocytes Relative: 9 % — ABNORMAL LOW (ref 12–46)
MCH: 29.1 pg (ref 26.0–34.0)
MCHC: 31.6 g/dL (ref 30.0–36.0)
MCV: 92.2 fL (ref 78.0–100.0)
MONO ABS: 1 10*3/uL (ref 0.1–1.0)
MONOS PCT: 7 % (ref 3–12)
Neutro Abs: 12.4 10*3/uL — ABNORMAL HIGH (ref 1.7–7.7)
Neutrophils Relative %: 83 % — ABNORMAL HIGH (ref 43–77)
PLATELETS: 202 10*3/uL (ref 150–400)
RBC: 3.33 MIL/uL — ABNORMAL LOW (ref 3.87–5.11)
RDW: 19.5 % — AB (ref 11.5–15.5)
WBC: 15 10*3/uL — ABNORMAL HIGH (ref 4.0–10.5)

## 2015-09-07 MED ORDER — AMOXICILLIN-POT CLAVULANATE 875-125 MG PO TABS: 1.0000 | ORAL_TABLET | Freq: Two times a day (BID) | ORAL | Status: DC

## 2015-09-07 MED ORDER — NORETHINDRONE 0.35 MG PO TABS: 1.0000 | ORAL_TABLET | Freq: Every day | ORAL | Status: DC

## 2015-09-07 NOTE — Discharge Summary (Signed)
Obstetric Discharge Summary Reason for Admission: PROM Prenatal Procedures: none Intrapartum Procedures: spontaneous vaginal delivery Postpartum Procedures: antibiotics Complications-Operative and Postpartum: postpartum fever: started augmentin for endometritis vs UTI vs pyelo  Patient is 19 y.o. G1P0 [redacted]w[redacted]d admitted for PROM and progressed well with augmentation. 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 Last outbreak in 2015 and SSE did not show any active lesions on labia, introitus or vaginal mucosa. She progressed quickly to complete and infant delivered with ~5 contractions and good pushing effort. Particulate meconium present. There were variable decelerations with return to baseline between pushing but moderate variability throughout.  Infant had nuchal x1 that was delivered through. The infant was not stimulated and spontaneously cried.   Delivery Note At 2:59 PM a viable female was delivered via NSVD (Presentation:OA ).  APGAR: 8, 9; weight-pending  .   Placenta status: intact.  Cord: 3-vessel with the following complications:particulate meconium, terminal meconium .  Cord pH: n/a  Anesthesia:  Epidural Episiotomy:  none Lacerations:  none Suture Repair: n/a Est. Blood Loss (mL):  100   Hospital Course:  Principal Problem:   NSVD (normal spontaneous vaginal delivery) Active Problems:   Genital herpes affecting pregnancy   Amniotic fluid leaking   [name] Richards is a 19 y.o. G1P1001 s/p IVD.  Patient was admitted for PROM.  She has postpartum course that was complicated by postpartum fever of 101.9 with lower abdominal and back pain. Also had some dysuria. CT of abdomen was obtained due to conern for possible appendicitis. CT showed no appendicitis, no perinephric stranding, no evidence of pyelo. However, based on PE and UA, will treat empirically for endometritis vs pyelo vs UTI with augmentin. 1st 2 doses of augmentin given in hospital. Will treat for total of 10 days. Patient did  have increase in WBC count from 12.7 to 15, but no significant increase in neutrophil count and no bandemia.The pt feels ready to go home and will be discharged with outpatient follow-up.   Today: No acute events overnight.  Pt denies problems with ambulating, voiding or po intake.  She denies nausea or vomiting.  Pain is well controlled.  She has had flatus. She has not had bowel movement.  Lochia Small.  Plan for birth control is  oral progesterone-only contraceptive.  Method of Feeding: breast  Physical Exam:  General: alert, cooperative and appears stated age Lochia: appropriate Uterine Fundus: firm Incision: none DVT Evaluation: No evidence of DVT seen on physical exam. Negative Homan's sign.  H/H: Lab Results  Component Value Date/Time   HGB 9.7* 09/07/2015 07:45 AM   HGB 13.3 10/13/2013 05:45 PM   HCT 30.7* 09/07/2015 07:45 AM   HCT 39.2 10/13/2013 05:45 PM    Discharge Diagnoses: Term Pregnancy-delivered PROM-13hours  Discharge Information: Date: 09/07/2015 Activity: pelvic rest Diet: routine  Medications: PNV, Ibuprofen and augmentin x 10 days; Start POP 4th Sunday after delivery. Breast feeding:  Yes Condition: stable Instructions: refer to handout Discharge to: home   Discharge Instructions    Activity as tolerated    Complete by:  As directed      Call MD for:  difficulty breathing, headache or visual disturbances    Complete by:  As directed      Call MD for:  difficulty breathing, headache or visual disturbances    Complete by:  As directed      Call MD for:  extreme fatigue    Complete by:  As directed      Call MD for:  extreme fatigue    Complete by:  As directed      Call MD for:  hives    Complete by:  As directed      Call MD for:  persistant dizziness or light-headedness    Complete by:  As directed      Call MD for:  persistant dizziness or light-headedness    Complete by:  As directed      Call MD for:  persistant nausea and vomiting     Complete by:  As directed      Call MD for:  persistant nausea and vomiting    Complete by:  As directed      Call MD for:  severe uncontrolled pain    Complete by:  As directed      Call MD for:  severe uncontrolled pain    Complete by:  As directed      Call MD for:  temperature >100.4    Complete by:  As directed      Call MD for:  temperature >100.4    Complete by:  As directed      Diet - low sodium heart healthy    Complete by:  As directed      Discharge instructions    Complete by:  As directed   Follow up in 6 weeks for routine postpartum check. Nothing in vagina for 6 weeks including no tampons, no douching, no sexual intercourse. Even if you are breast feeding, you can still get pregnant. If planning to have intercourse, please call to schedule follow up sooner to get contraceptive. If having sexual intercourse before appointment, please use condoms although abstinence is recommended as only guarantee to not get pregnant.     Driving restriction     Complete by:  As directed   Avoid driving for at least 2 weeks.     Lifting restrictions    Complete by:  As directed   Weight restriction of 15 lbs.     Lifting restrictions    Complete by:  As directed   Weight restriction of 15 lbs.     Sexual acrtivity    Complete by:  As directed   Pelvic rest (no tampons or sex) for six weeks.     Sexual acrtivity    Complete by:  As directed   No sexual activity until follow up visit in 6 weeks.            Medication List    STOP taking these medications        amoxicillin 500 MG capsule  Commonly known as:  AMOXIL      TAKE these medications        amoxicillin-clavulanate 875-125 MG per tablet  Commonly known as:  AUGMENTIN  Take 1 tablet by mouth every 12 (twelve) hours.     cyclobenzaprine 10 MG tablet  Commonly known as:  FLEXERIL  Take 1 tablet (10 mg total) by mouth 3 (three) times daily as needed for muscle spasms.     docusate sodium 100 MG capsule  Commonly  known as:  COLACE  Take 1 capsule (100 mg total) by mouth 2 (two) times daily.     ibuprofen 600 MG tablet  Commonly known as:  ADVIL,MOTRIN  Take 1 tablet (600 mg total) by mouth every 6 (six) hours.     norethindrone 0.35 MG tablet  Commonly known as:  MICRONOR,CAMILA,ERRIN  Take 1 tablet (0.35 mg total) by mouth daily. Start pill  pack on 4th Sunday after delivery.     oxyCODONE-acetaminophen 5-325 MG per tablet  Commonly known as:  PERCOCET/ROXICET  Take 1 tablet by mouth every 4 (four) hours as needed (for pain scale 4-7).     Prenatal Vitamins 0.8 MG tablet  Take 1 tablet by mouth daily.           Follow-up Information    Follow up with [facility]. Schedule an appointment as soon as possible for a visit in 6 weeks.   Why:  For hospital follow-up   Contact information:   [address] [place] [address] [phone]      Durenda Hurt ,DO PGY2 Resident Physician 09/07/2015,9:09 AM  OB fellow attestation I have seen and examined this patient and agree with above documentation in the resident's note.   [name] Richards is a 19 y.o. G1P1001 s/p NSVD with PP fever. She was started on augmentin and feeling better today.   Pain is well controlled.  Plan for birth control is oral progesterone-only contraceptive.  Method of Feeding: breast  PE:  BP 109/51 mmHg  Pulse 103  Temp(Src) 99 F (37.2 C) (Oral)  Resp 16  Ht 5\' 1"  (1.549 m)  Wt 158 lb (71.668 kg)  BMI 29.87 kg/m2  SpO2 100%  LMP 12/01/2014  Breastfeeding? Unknown Fundus firm  Recent Labs  09/06/15 1458 09/07/15 0745  HGB 9.7* 9.7*  HCT 29.9* 30.7*   Plan: discharge today - postpartum care discussed - f/u clinic in 6 weeks for postpartum visit - Suspected UTI- continue course of augmentin, continue for total of 7 days of treatment   Federico Flake, MD 11:11 AM

## 2015-09-07 NOTE — Lactation Note (Signed)
This note was copied from the chart of [name] Ardis Renken. Lactation Consultation Note: Mother plans to continue to pump every 2-3 hours for infant. Mother is not active with WIC at this time. She was scheduled an appt on Sept 19 for certification. A WIC referral form was sent to [facility] and I advised mother to follow up with [facility]. She also was given a [facility] loaner packet to take home a Loaner pump. Mother states that she prefers to try and get a pump from [facility].  Mother was unable to get a pump from [facility]. She will follow up with [facility] today when she returns to the hospital to see her infant, to rent a [facility] loaner.  Mother was given a [employer]ure and reviewed collection, storage and transportation guidelines of breast milk. Mother was given colostrum yellow dots . She has breastmilk labels. Advised mother to seek [facility] for consult when infant is ready to go back to the breast. Advised mother of the importance of protecting her milk supply .   Patient Name: [name] Richards [id number] Date: 09/07/2015     Maternal Data    Feeding    LATCH Score/Interventions                      Lactation Tools Discussed/Used     Consult Status      [name] 09/07/2015, 5:13 PM

## 2015-09-07 NOTE — Progress Notes (Signed)
Per Docia Chuck

## 2015-09-08 LAB — URINE CULTURE

## 2015-09-09 ENCOUNTER — Telehealth: Payer: Self-pay | Admitting: Family Medicine

## 2015-09-09 ENCOUNTER — Other Ambulatory Visit: Payer: Self-pay | Admitting: Family Medicine

## 2015-09-09 DIAGNOSIS — N390 Urinary tract infection, site not specified: Secondary | ICD-10-CM | POA: Insufficient documentation

## 2015-09-09 MED ORDER — CEPHALEXIN 500 MG PO CAPS: 500.0000 mg | ORAL_CAPSULE | Freq: Two times a day (BID) | ORAL | Status: DC

## 2015-09-09 NOTE — Telephone Encounter (Signed)
Attempted to call numbers on chart and inform patient of resistant species and need to stop augmentin and start Keflex.  I was unable to reach the patient and will sent this to our clinical pool to try to contact the patient.  Federico Flake, MD

## 2015-09-10 NOTE — Telephone Encounter (Signed)
Contacted patient, informed of change in antibiotic.  Pt verbalizes understanding and will pick up prescription today.

## 2015-09-12 ENCOUNTER — Ambulatory Visit: Payer: Self-pay

## 2015-09-12 NOTE — Lactation Note (Signed)
This note was copied from the chart of Boy Meribeth Cohenour. Lactation Consultation Note  Patient Name: Boy Shamira Toutant [id number] Date: 09/12/2015 Reason for consult: Follow-up assessment;NICU baby Mom here to visit with baby. Mom concerned about trauma on right nipple. She has history of HSV and concerned if this is breakout on her nipple. Mom requested lactation to look and advise. Mom reports to [facility] that prior to nursing the last time there was no trauma on the right nipple. Mom reports pain throughout the feeding and when baby came off the breast the skin was off the end of her nipple. On exam, the end of the nipple is excoriated, no bleeding or blisters observed. Mom has very short almost flat nipple. Breasts are full with milk. Mom has never had HSV outbreak on her breasts. Advised Mom this did not appear to be herpes lesion and with her description of how this occurred it appeared to be trauma from an inadequate latch. Mom reports no pain with pumping and no trauma from pumping either. Advised Mom she needs to empty her breasts since they are very full, she plans to go home to pump. Advised to call her OB tomorrow for follow up. Advised to apply EBM to sore area.  Maternal Data    Feeding Feeding Type: Breast Milk Nipple Type: Slow - flow  LATCH Score/Interventions                      Lactation Tools Discussed/Used     Consult Status Consult Status: Follow-up Date: 09/13/15 Follow-up type: In-patient    Alfred Levins 09/12/2015, 6:02 PM

## 2015-10-19 ENCOUNTER — Ambulatory Visit: Payer: Self-pay | Admitting: Advanced Practice Midwife

## 2015-11-22 ENCOUNTER — Ambulatory Visit: Payer: Medicaid Other | Admitting: Obstetrics & Gynecology

## 2015-11-24 ENCOUNTER — Other Ambulatory Visit: Payer: Self-pay | Admitting: Advanced Practice Midwife

## 2015-11-24 ENCOUNTER — Encounter: Payer: Self-pay | Admitting: Obstetrics & Gynecology

## 2015-11-24 ENCOUNTER — Ambulatory Visit (INDEPENDENT_AMBULATORY_CARE_PROVIDER_SITE_OTHER): Payer: Medicaid Other | Admitting: Obstetrics & Gynecology

## 2015-11-24 MED ORDER — FLUCONAZOLE 150 MG PO TABS: ORAL_TABLET | ORAL | Status: DC

## 2015-11-24 NOTE — Progress Notes (Deleted)
Subjective:     [name] Richards is a 19 y.o. female who presents for a postpartum visit. She is 12 weeks postpartum following a spontaneous vaginal delivery. I have fully reviewed the prenatal and intrapartum course. The delivery was at 38 gestational weeks. Outcome: spontaneous vaginal delivery. Anesthesia: epidural. Postpartum course has been ***. Baby's course has been ***. Baby is feeding by bottle - Carnation Good Start DHA and ARA and Enfamil with Iron. Bleeding no bleeding. Bowel function is normal. Bladder function is normal. Patient is sexually active. Contraception method is condoms. Postpartum depression screening: negative.  {Common ambulatory SmartLinks:19316}  Review of Systems {ros; complete:30496}   Objective:    There were no vitals taken for this visit.  General:  {gen appearance:16600}   Breasts:  {breast exam:1202::"inspection negative, no nipple discharge or bleeding, no masses or nodularity palpable"}  Lungs: {lung exam:16931}  Heart:  {heart exam:5510}  Abdomen: {abdomen exam:16834}   Vulva:  {labia exam:12198}  Vagina: {vagina exam:12200}  Cervix:  {cervix exam:14595}  Corpus: {uterus exam:12215}  Adnexa:  {adnexa exam:12223}  Rectal Exam: {rectal/vaginal exam:12274}        Assessment:    *** postpartum exam. Pap smear {done:10129} at today's visit.   Plan:    1. Contraception: {method:5051} 2. *** 3. Follow up in: {1-10:13787} {time; units:19136} or as needed.

## 2015-11-24 NOTE — Progress Notes (Signed)
Pt called and was directed to MAU because she went to pharmacy and medications were not there. [facility] and per postpartum visit note she was prescribed Diflucan for possible yeast infection of breasts.  Diflucan 150 mg x 2 doses 3 days apart sent to pt pharmacy at this time.

## 2015-11-24 NOTE — Progress Notes (Signed)
  Subjective:     [name] Richards is a 19 y.o. MH 67P1  female who presents for a postpartum visit. She is 10 weeks postpartum following a spontaneous vaginal delivery. I have fully reviewed the prenatal and intrapartum course. The delivery was at 38 gestational weeks. Outcome: spontaneous vaginal delivery. Anesthesia: epidural. Postpartum course has been normal since she was discharged home. Baby's course has been normal except for being treated for thrush.Pecola Leisure. Baby is feeding by bottle - [employer]. Bleeding no bleeding. Bowel function is normal. Bladder function is normal. Patient is sexually active. Contraception method is condoms. Postpartum depression screening: negative.  The following portions of the patient's history were reviewed and updated as appropriate: allergies, current medications, past family history, past medical history, past social history, past surgical history and problem list.  Review of Systems Pertinent items are noted in HPI. She stopped breastfeeding when her son developed thrush as her husband didn't want her to give the baby any more infections. She had started the OCPs but her mother convinced her to stop that and use condoms.  Objective:    BP 110/64 mmHg  Pulse 92  Wt 123 lb 6.4 oz (55.974 kg)  Breastfeeding? No  General:  alert   Breasts:   full and leaking copious amounts of milk  Lungs: clear to auscultation bilaterally  Heart:  regular rate and rhythm, S1, S2 normal, no murmur, click, rub or gallop  Abdomen: soft, non-tender; bowel sounds normal; no masses,  no organomegaly   Vulva:  not evaluated  Vagina: not evaluated  Cervix:  not evaluated  Corpus: not examined  Adnexa:  not evaluated  Rectal Exam: Not performed.        Assessment:     Norma postpartum exam. Pap smear not done at today's visit.   Plan:    1. Contraception: condoms 2.  I have encouraged her to restart breastfeeding. I will prescribe a diflucan but I have told her that I don't believe that  she has yeast of her breasts. I think they are hurting because of engorgement 3. Follow up in: 1 year or as needed.

## 2015-12-26 NOTE — L&D Delivery Note (Signed)
Delivery Note Anterior lip moved back and head emerged in just a few pushes. At 2:17 PM a viable and healthy female was delivered via  (Presentation: OA ).  APGAR: , ; weight  .   Placenta status: Spontaneous and grossly intact with 3VC, with the following complications: none  Anesthesia:  epidural Episiotomy:  none Lacerations:  None Suture Repair: none Est. Blood Loss (mL):  75  Mom to postpartum.  Baby to Couplet care / Skin to Skin.  [name] Richards Richards,[name] Richards 10/24/2016, 2:31 PM

## 2016-02-15 ENCOUNTER — Inpatient Hospital Stay (HOSPITAL_COMMUNITY): Payer: Self-pay

## 2016-02-15 ENCOUNTER — Encounter (HOSPITAL_COMMUNITY): Payer: Self-pay

## 2016-02-15 ENCOUNTER — Inpatient Hospital Stay (HOSPITAL_COMMUNITY)
Admission: AD | Admit: 2016-02-15 | Discharge: 2016-02-15 | Disposition: A | Discharge status: Home / Self Care | Payer: Self-pay | Source: Ambulatory Visit | Attending: Family Medicine | Admitting: Family Medicine

## 2016-02-15 DIAGNOSIS — O26891 Other specified pregnancy related conditions, first trimester: Secondary | ICD-10-CM | POA: Insufficient documentation

## 2016-02-15 DIAGNOSIS — R102 Pelvic and perineal pain: Secondary | ICD-10-CM

## 2016-02-15 DIAGNOSIS — O3680X0 Pregnancy with inconclusive fetal viability, not applicable or unspecified: Secondary | ICD-10-CM

## 2016-02-15 DIAGNOSIS — N83201 Unspecified ovarian cyst, right side: Secondary | ICD-10-CM | POA: Insufficient documentation

## 2016-02-15 DIAGNOSIS — R1032 Left lower quadrant pain: Secondary | ICD-10-CM | POA: Insufficient documentation

## 2016-02-15 DIAGNOSIS — Z3A01 Less than 8 weeks gestation of pregnancy: Secondary | ICD-10-CM | POA: Insufficient documentation

## 2016-02-15 LAB — URINALYSIS, ROUTINE W REFLEX MICROSCOPIC
Bilirubin Urine: NEGATIVE
GLUCOSE, UA: NEGATIVE mg/dL
HGB URINE DIPSTICK: NEGATIVE
KETONES UR: NEGATIVE mg/dL
LEUKOCYTES UA: NEGATIVE
Nitrite: NEGATIVE
PH: 5.5 (ref 5.0–8.0)
PROTEIN: NEGATIVE mg/dL
Specific Gravity, Urine: 1.025 (ref 1.005–1.030)

## 2016-02-15 LAB — WET PREP, GENITAL
CLUE CELLS WET PREP: NONE SEEN
Sperm: NONE SEEN
Trich, Wet Prep: NONE SEEN
Yeast Wet Prep HPF POC: NONE SEEN

## 2016-02-15 LAB — CBC
HCT: 37.1 % (ref 36.0–46.0)
Hemoglobin: 12.5 g/dL (ref 12.0–15.0)
MCH: 29.8 pg (ref 26.0–34.0)
MCHC: 33.7 g/dL (ref 30.0–36.0)
MCV: 88.3 fL (ref 78.0–100.0)
PLATELETS: 211 10*3/uL (ref 150–400)
RBC: 4.2 MIL/uL (ref 3.87–5.11)
RDW: 12.9 % (ref 11.5–15.5)
WBC: 8.8 10*3/uL (ref 4.0–10.5)

## 2016-02-15 LAB — HCG, QUANTITATIVE, PREGNANCY: HCG, BETA CHAIN, QUANT, S: 854 m[IU]/mL — AB (ref <5)

## 2016-02-15 LAB — ABO/RH: ABO/RH(D): O POS

## 2016-02-15 LAB — POCT PREGNANCY, URINE: Preg Test, Ur: POSITIVE — AB

## 2016-02-15 NOTE — Discharge Instructions (Signed)

## 2016-02-15 NOTE — MAU Note (Addendum)
Pt C/O LLQ pain x 2 weeks, unsure if pregnant.  Has pain in L side with urination.  Unsure if pregnant.  Denies vag bleeding or discharge.  Pos HPT yesterday.

## 2016-02-15 NOTE — MAU Provider Note (Signed)
Chief Complaint: Abdominal Pain and Dysuria   First Provider Initiated Contact with Patient 02/15/16 1912      SUBJECTIVE HPI: [name] Richards is a 20 y.o. G2P1001 with pos HPT but no menses since her vaginal delivery 08/2015 who presents to maternity admissions reporting LLQ pain x 1 month, similar to pain with her last pregnancy.  She has not tried anything for her pain, nothing makes it better or worse.   She denies vaginal bleeding, vaginal itching/burning, urinary symptoms, h/a, dizziness, n/v, or fever/chills.     HPI  Past Medical History  Diagnosis Date  . Medical history non-contributory    Past Surgical History  Procedure Laterality Date  . Excision vaginal cyst     Social History   Social History  . Marital Status: Single    Spouse Name: N/A  . Number of Children: N/A  . Years of Education: N/A   Occupational History  . Not on file.   Social History Main Topics  . Smoking status: Never Smoker   . Smokeless tobacco: Never Used  . Alcohol Use: No  . Drug Use: No  . Sexual Activity: Yes    Birth Control/ Protection: None   Other Topics Concern  . Not on file   Social History Narrative   No current facility-administered medications on file prior to encounter.   Current Outpatient Prescriptions on File Prior to Encounter  Medication Sig Dispense Refill  . cephALEXin (KEFLEX) 500 MG capsule Take 1 capsule (500 mg total) by mouth 2 (two) times daily. (Patient not taking: Reported on 11/24/2015) 28 capsule 2  . cyclobenzaprine (FLEXERIL) 10 MG tablet Take 1 tablet (10 mg total) by mouth 3 (three) times daily as needed for muscle spasms. (Patient not taking: Reported on 11/24/2015) 30 tablet 0  . docusate sodium (COLACE) 100 MG capsule Take 1 capsule (100 mg total) by mouth 2 (two) times daily. (Patient not taking: Reported on 02/15/2016) 10 capsule 0  . fluconazole (DIFLUCAN) 150 MG tablet Take 1 tablet now and 1 in 3 days. (Patient not taking: Reported on 02/15/2016) 2  tablet 0  . ibuprofen (ADVIL,MOTRIN) 600 MG tablet Take 1 tablet (600 mg total) by mouth every 6 (six) hours. (Patient not taking: Reported on 11/24/2015) 30 tablet 0  . norethindrone (MICRONOR,CAMILA,ERRIN) 0.35 MG tablet Take 1 tablet (0.35 mg total) by mouth daily. Start pill pack on 4th Sunday after delivery. (Patient not taking: Reported on 11/24/2015) 1 Package 11  . oxyCODONE-acetaminophen (PERCOCET/ROXICET) 5-325 MG per tablet Take 1 tablet by mouth every 4 (four) hours as needed (for pain scale 4-7). (Patient not taking: Reported on 11/24/2015) 30 tablet 0  . Prenatal Multivit-Min-Fe-FA (PRENATAL VITAMINS) 0.8 MG tablet Take 1 tablet by mouth daily. (Patient not taking: Reported on 11/24/2015) 30 tablet 12   No Known Allergies  ROS:  Review of Systems  Constitutional: Negative for fever, chills and fatigue.  Respiratory: Negative for shortness of breath.   Cardiovascular: Negative for chest pain.  Gastrointestinal: Positive for abdominal pain.  Genitourinary: Positive for pelvic pain. Negative for dysuria, flank pain, vaginal bleeding, vaginal discharge, difficulty urinating and vaginal pain.  Neurological: Negative for dizziness and headaches.  Psychiatric/Behavioral: Negative.      I have reviewed patient's Past Medical Hx, Surgical Hx, Family Hx, Social Hx, medications and allergies.   Physical Exam   Patient Vitals for the past 24 hrs:  BP Temp Temp src Pulse Resp  02/15/16 1840 110/67 mmHg 97.9 F (36.6 C) Oral 92 16  Constitutional: Well-developed, well-nourished female in no acute distress.  Cardiovascular: normal rate Respiratory: normal effort GI: Abd soft, non-tender. Pos BS x 4 MS: Extremities nontender, no edema, normal ROM Neurologic: Alert and oriented x 4.  GU: Neg CVAT.  PELVIC EXAM: Cervix pink, visually closed, without lesion, scant white creamy discharge, vaginal walls and external genitalia normal Bimanual exam: Cervix 0/long/high, firm, anterior,  neg CMT, uterus nontender, nonenlarged, adnexa without tenderness, enlargement, or mass   LAB RESULTS Results for orders placed or performed during the hospital encounter of 02/15/16 (from the past 24 hour(s))  Urinalysis, Routine w reflex microscopic (not at [facility])     Status: None   Collection Time: 02/15/16  6:45 PM  Result Value Ref Range   Color, Urine YELLOW YELLOW   APPearance CLEAR CLEAR   Specific Gravity, Urine 1.025 1.005 - 1.030   pH 5.5 5.0 - 8.0   Glucose, UA NEGATIVE NEGATIVE mg/dL   Hgb urine dipstick NEGATIVE NEGATIVE   Bilirubin Urine NEGATIVE NEGATIVE   Ketones, ur NEGATIVE NEGATIVE mg/dL   Protein, ur NEGATIVE NEGATIVE mg/dL   Nitrite NEGATIVE NEGATIVE   Leukocytes, UA NEGATIVE NEGATIVE  Pregnancy, urine POC     Status: Abnormal   Collection Time: 02/15/16  6:50 PM  Result Value Ref Range   Preg Test, Ur POSITIVE (A) NEGATIVE  CBC     Status: None   Collection Time: 02/15/16  7:19 PM  Result Value Ref Range   WBC 8.8 4.0 - 10.5 K/uL   RBC 4.20 3.87 - 5.11 MIL/uL   Hemoglobin 12.5 12.0 - 15.0 g/dL   HCT 40.9 81.1 - 91.4 %   MCV 88.3 78.0 - 100.0 fL   MCH 29.8 26.0 - 34.0 pg   MCHC 33.7 30.0 - 36.0 g/dL   RDW 78.2 95.6 - 21.3 %   Platelets 211 150 - 400 K/uL  hCG, quantitative, pregnancy     Status: Abnormal   Collection Time: 02/15/16  7:19 PM  Result Value Ref Range   hCG, Beta Chain, Quant, S 854 (H) <5 mIU/mL  ABO/Rh     Status: None   Collection Time: 02/15/16  7:19 PM  Result Value Ref Range   ABO/RH(D) O POS   Wet prep, genital     Status: Abnormal   Collection Time: 02/15/16  7:30 PM  Result Value Ref Range   Yeast Wet Prep HPF POC NONE SEEN NONE SEEN   Trich, Wet Prep NONE SEEN NONE SEEN   Clue Cells Wet Prep HPF POC NONE SEEN NONE SEEN   WBC, Wet Prep HPF POC MODERATE (A) NONE SEEN   Sperm NONE SEEN     --/--/O POS (02/21 1919)  IMAGING US Ob Comp Less 14 Wks  02/15/2016  CLINICAL DATA:  Pelvic pain left lower quadrant for 2 weeks,  quantitative beta HCG 854 EXAM: OBSTETRIC <14 WK [place] AND TRANSVAGINAL OB US TECHNIQUE: Both transabdominal and transvaginal ultrasound examinations were performed for complete evaluation of the gestation as well as the maternal uterus, adnexal regions, and pelvic cul-de-sac. Transvaginal technique was performed to assess early pregnancy. COMPARISON:  None. FINDINGS: Intrauterine gestational sac: None Right ovary demonstrates the presence of a simple cyst measuring 5 x 5 x 4 cm. The left ovary is normal. There is blood flow in the wall of the cyst. There is no free fluid. IMPRESSION: Although there is blood flow in the wall the cyst, the 5 cm right ovarian cyst appears otherwise simple. Currently no evidence of  intrauterine gestation with differential diagnostic possibilities including pregnancy that is too early to image, spontaneous missed abortion, or nonvisualized ectopic. Given the simple appearance of the right ovarian cyst, the duration of symptoms, and the beta HCG level, the findings are not considered specific for particularly suggestive for ectopic pregnancy. Recommend follow-up quantitative B-HCG levels and follow-up US in 14 days to confirm and assess viability. This recommendation follows SRU consensus guidelines: Diagnostic Criteria for Nonviable Pregnancy Early in the First Trimester. Malva Limes Med 2013; 161:0960-45. Electronically Signed   By: Esperanza Heir M.D.   On: 02/15/2016 21:17   US Ob Transvaginal  02/15/2016  CLINICAL DATA:  Pelvic pain left lower quadrant for 2 weeks, quantitative beta HCG 854 EXAM: OBSTETRIC <14 WK [place] AND TRANSVAGINAL OB US TECHNIQUE: Both transabdominal and transvaginal ultrasound examinations were performed for complete evaluation of the gestation as well as the maternal uterus, adnexal regions, and pelvic cul-de-sac. Transvaginal technique was performed to assess early pregnancy. COMPARISON:  None. FINDINGS: Intrauterine gestational sac: None Right ovary demonstrates  the presence of a simple cyst measuring 5 x 5 x 4 cm. The left ovary is normal. There is blood flow in the wall of the cyst. There is no free fluid. IMPRESSION: Although there is blood flow in the wall the cyst, the 5 cm right ovarian cyst appears otherwise simple. Currently no evidence of intrauterine gestation with differential diagnostic possibilities including pregnancy that is too early to image, spontaneous missed abortion, or nonvisualized ectopic. Given the simple appearance of the right ovarian cyst, the duration of symptoms, and the beta HCG level, the findings are not considered specific for particularly suggestive for ectopic pregnancy. Recommend follow-up quantitative B-HCG levels and follow-up US in 14 days to confirm and assess viability. This recommendation follows SRU consensus guidelines: Diagnostic Criteria for Nonviable Pregnancy Early in the First Trimester. Malva Limes Med 2013; 409:8119-14. Electronically Signed   By: Esperanza Heir M.D.   On: 02/15/2016 21:17    OB US pending  Report to Vonzella Nipple, PA     Medication List    STOP taking these medications        cephALEXin 500 MG capsule  Commonly known as:  KEFLEX     fluconazole 150 MG tablet  Commonly known as:  DIFLUCAN     ibuprofen 600 MG tablet  Commonly known as:  ADVIL,MOTRIN     norethindrone 0.35 MG tablet  Commonly known as:  MICRONOR,CAMILA,ERRIN     oxyCODONE-acetaminophen 5-325 MG tablet  Commonly known as:  PERCOCET/ROXICET      TAKE these medications        cyclobenzaprine 10 MG tablet  Commonly known as:  FLEXERIL  Take 1 tablet (10 mg total) by mouth 3 (three) times daily as needed for muscle spasms.     docusate sodium 100 MG capsule  Commonly known as:  COLACE  Take 1 capsule (100 mg total) by mouth 2 (two) times daily.     Prenatal Vitamins 0.8 MG tablet  Take 1 tablet by mouth daily.       Follow-up Information    Follow up with [facility] On 02/18/2016.    Specialty:  Obstetrics and Gynecology   Why:  02/18/16 at 8:00 am for follow-up labs   Contact information:   [address] [place] [address] [phone]      Follow up with [facility].   Why:  As needed, If symptoms  worsen   Contact information:   [address] [id number] mc [place] [address] [phone]      Sharen Counter Certified Nurse-Midwife 02/15/2016  9:04 PM  A: Pregnancy of unknown location Abdominal pain in pregnancy  P: Discharge home Tylenol PRN for pain Ectopic precautions discussed Patient advised to follow-up in MAU on Thursday morning for repeat labs as she will be leaving town that afternoon for the weekend Patient may return to MAU as needed or if her condition were to change or worsen  Marny Lowenstein, PA-C  02/15/2016 9:25 PM

## 2016-02-16 LAB — GC/CHLAMYDIA PROBE AMP (~~LOC~~) NOT AT ARMC
Chlamydia: NEGATIVE
NEISSERIA GONORRHEA: NEGATIVE

## 2016-02-16 LAB — HIV ANTIBODY (ROUTINE TESTING W REFLEX): HIV SCREEN 4TH GENERATION: NONREACTIVE

## 2016-02-18 ENCOUNTER — Ambulatory Visit: Payer: Self-pay | Admitting: Obstetrics and Gynecology

## 2016-02-18 NOTE — Progress Notes (Signed)
Called pt in regards to missed STAT appt @ 0800.  LM on both contact #'s indicating that she will need to go MAU for the lab draw due to our office closing at 12noon.

## 2016-04-08 ENCOUNTER — Inpatient Hospital Stay (HOSPITAL_COMMUNITY)
Admission: AD | Admit: 2016-04-08 | Discharge: 2016-04-08 | Disposition: A | Discharge status: Home / Self Care | Payer: Self-pay | Source: Ambulatory Visit | Attending: Family Medicine | Admitting: Family Medicine

## 2016-04-08 ENCOUNTER — Encounter (HOSPITAL_COMMUNITY): Payer: Self-pay

## 2016-04-08 DIAGNOSIS — R109 Unspecified abdominal pain: Secondary | ICD-10-CM

## 2016-04-08 DIAGNOSIS — O26899 Other specified pregnancy related conditions, unspecified trimester: Secondary | ICD-10-CM

## 2016-04-08 DIAGNOSIS — Z3A00 Weeks of gestation of pregnancy not specified: Secondary | ICD-10-CM | POA: Insufficient documentation

## 2016-04-08 DIAGNOSIS — Z3491 Encounter for supervision of normal pregnancy, unspecified, first trimester: Secondary | ICD-10-CM

## 2016-04-08 DIAGNOSIS — O219 Vomiting of pregnancy, unspecified: Secondary | ICD-10-CM

## 2016-04-08 DIAGNOSIS — O26891 Other specified pregnancy related conditions, first trimester: Secondary | ICD-10-CM | POA: Insufficient documentation

## 2016-04-08 DIAGNOSIS — O9989 Other specified diseases and conditions complicating pregnancy, childbirth and the puerperium: Secondary | ICD-10-CM

## 2016-04-08 DIAGNOSIS — R112 Nausea with vomiting, unspecified: Secondary | ICD-10-CM | POA: Insufficient documentation

## 2016-04-08 DIAGNOSIS — R1030 Lower abdominal pain, unspecified: Secondary | ICD-10-CM | POA: Insufficient documentation

## 2016-04-08 HISTORY — DX: Herpesviral infection, unspecified: B00.9

## 2016-04-08 LAB — URINALYSIS, ROUTINE W REFLEX MICROSCOPIC
Bilirubin Urine: NEGATIVE
GLUCOSE, UA: NEGATIVE mg/dL
Hgb urine dipstick: NEGATIVE
Ketones, ur: NEGATIVE mg/dL
LEUKOCYTES UA: NEGATIVE
Nitrite: NEGATIVE
PROTEIN: NEGATIVE mg/dL
Specific Gravity, Urine: 1.02 (ref 1.005–1.030)
pH: 6.5 (ref 5.0–8.0)

## 2016-04-08 MED ORDER — PRENATAL VITAMINS 0.8 MG PO TABS: 1.0000 | ORAL_TABLET | Freq: Every day | ORAL | Status: DC

## 2016-04-08 MED ORDER — METOCLOPRAMIDE HCL 10 MG PO TABS: 10.0000 mg | ORAL_TABLET | Freq: Three times a day (TID) | ORAL | Status: DC

## 2016-04-08 MED ORDER — PROMETHAZINE HCL 25 MG PO TABS
25.0000 mg | ORAL_TABLET | Freq: Once | ORAL | Status: AC
  Administered 2016-04-08: 25 mg via ORAL
  Filled 2016-04-08: qty 1

## 2016-04-08 NOTE — MAU Provider Note (Signed)
History     CSN: [id number]  Arrival date and time: 04/08/16 2141   First Provider Initiated Contact with Patient 04/08/16 2219      Chief Complaint  Patient presents with  . Abdominal Pain   HPI   [name] Richards is a 20 y.o. female G2P1001 at unknown gestation presenting with lower abdominal pain. LMP is unknown, although patient does recall having intercourse close to her 6 weeks postpartum visit (Nov or Dec, 2016). She is here with newer onset abdominal pain. The pain is located all throughout her abdomen. The pain started 1-2 weeks ago, however it has worsened in the last 3 days. She has not taken anything for the pain.   She is having nausea and vomiting, which occurs  2-3 times per day. She has a poor appetite and she feels weak. She has transportation issues due to her significant other having back troubles. She was unable to come to for any follow up visits that were recommended.   She denies vaginal bleeding She denies fetal movement  OB History    Gravida Para Term Preterm AB TAB SAB Ectopic Multiple Living   2 1 1       0 1      Past Medical History  Diagnosis Date  . Medical history non-contributory   . Herpes     Past Surgical History  Procedure Laterality Date  . Excision vaginal cyst      Family History  Problem Relation Age of Onset  . Asthma Father   . Diabetes Maternal Grandmother   . Diabetes Maternal Grandfather     Social History  Substance Use Topics  . Smoking status: Never Smoker   . Smokeless tobacco: Never Used  . Alcohol Use: No    Allergies: No Known Allergies  Prescriptions prior to admission  Medication Sig Dispense Refill Last Dose  . cyclobenzaprine (FLEXERIL) 10 MG tablet Take 1 tablet (10 mg total) by mouth 3 (three) times daily as needed for muscle spasms. (Patient not taking: Reported on 11/24/2015) 30 tablet 0 Not Taking at Unknown time  . docusate sodium (COLACE) 100 MG capsule Take 1 capsule (100 mg total) by mouth 2  (two) times daily. (Patient not taking: Reported on 02/15/2016) 10 capsule 0 Not Taking at Unknown time  . Prenatal Multivit-Min-Fe-FA (PRENATAL VITAMINS) 0.8 MG tablet Take 1 tablet by mouth daily. (Patient not taking: Reported on 11/24/2015) 30 tablet 12 Not Taking at Unknown time   Results for orders placed or performed during the hospital encounter of 04/08/16 (from the past 48 hour(s))  Urinalysis, Routine w reflex microscopic (not [facility])     Status: None   Collection Time: 04/08/16  9:50 PM  Result Value Ref Range   Color, Urine YELLOW YELLOW   APPearance CLEAR CLEAR   Specific Gravity, Urine 1.020 1.005 - 1.030   pH 6.5 5.0 - 8.0   Glucose, UA NEGATIVE NEGATIVE mg/dL   Hgb urine dipstick NEGATIVE NEGATIVE   Bilirubin Urine NEGATIVE NEGATIVE   Ketones, ur NEGATIVE NEGATIVE mg/dL   Protein, ur NEGATIVE NEGATIVE mg/dL   Nitrite NEGATIVE NEGATIVE   Leukocytes, UA NEGATIVE NEGATIVE    Comment: MICROSCOPIC NOT DONE ON URINES WITH NEGATIVE PROTEIN, BLOOD, LEUKOCYTES, NITRITE, OR GLUCOSE <1000 mg/dL.    Review of Systems  Constitutional: Negative for fever and chills.  Gastrointestinal: Positive for nausea, vomiting and abdominal pain. Negative for diarrhea and constipation.  Genitourinary: Negative for dysuria and urgency.   Physical Exam   Blood  pressure 116/65, pulse 85, temperature 98.8 F (37.1 C), temperature source Oral, resp. rate 16, last menstrual period 12/01/2014, SpO2 96 %, not currently breastfeeding.  Physical Exam  Constitutional: She is oriented to person, place, and time. She appears well-developed and well-nourished.  HENT:  Head: Normocephalic.  Eyes: Pupils are equal, round, and reactive to light.  Respiratory: Effort normal.  GI: There is generalized tenderness. There is no rigidity, no rebound and no guarding.  Musculoskeletal: Normal range of motion.  Neurological: She is alert and oriented to person, place, and time.  Skin: Skin is warm.  Psychiatric:  Her behavior is normal.    MAU Course  Procedures  None  MDM  + fetal heart tones via doppler.  Phenergan 25 mg given PO in MAU.   Assessment and Plan   A:  1. Abdominal pain in pregnancy   2. Nausea and vomiting in pregnancy   3. Supervision of normal pregnancy in first trimester     P:  Discharge home in stable condition Increase PO fluid intake Small, frequent meals.  RX: vitamins, Reglan  Call the [facility] to scheduled prenatal visit.  Pregnancy verification letter given Return to MAU if symptoms worsen  Duane Lope, NP 04/08/2016 11:39 PM

## 2016-04-08 NOTE — Discharge Instructions (Signed)

## 2016-04-08 NOTE — MAU Note (Addendum)
Low abd pain and headache started 3 days ago, comes and goes.  Missed appt in clinic due to financial reasons and transportation.  Is going to call Health dept for appt instead.  No bleeding.  No change in d/c. Been having nausea since pregnancy began and it is hard to eat.

## 2016-05-15 ENCOUNTER — Encounter (HOSPITAL_COMMUNITY): Payer: Self-pay | Admitting: *Deleted

## 2016-05-15 ENCOUNTER — Emergency Department (HOSPITAL_COMMUNITY)
Admission: EM | Admit: 2016-05-15 | Discharge: 2016-05-16 | Disposition: A | Discharge status: Home / Self Care | Payer: Self-pay | Attending: Emergency Medicine | Admitting: Emergency Medicine

## 2016-05-15 ENCOUNTER — Emergency Department (HOSPITAL_COMMUNITY): Payer: Self-pay

## 2016-05-15 DIAGNOSIS — Z3A17 17 weeks gestation of pregnancy: Secondary | ICD-10-CM | POA: Insufficient documentation

## 2016-05-15 DIAGNOSIS — Z3492 Encounter for supervision of normal pregnancy, unspecified, second trimester: Secondary | ICD-10-CM

## 2016-05-15 DIAGNOSIS — F419 Anxiety disorder, unspecified: Secondary | ICD-10-CM | POA: Insufficient documentation

## 2016-05-15 DIAGNOSIS — O4402 Placenta previa specified as without hemorrhage, second trimester: Secondary | ICD-10-CM

## 2016-05-15 DIAGNOSIS — Z3491 Encounter for supervision of normal pregnancy, unspecified, first trimester: Secondary | ICD-10-CM

## 2016-05-15 DIAGNOSIS — R109 Unspecified abdominal pain: Secondary | ICD-10-CM

## 2016-05-15 DIAGNOSIS — O99342 Other mental disorders complicating pregnancy, second trimester: Secondary | ICD-10-CM | POA: Insufficient documentation

## 2016-05-15 DIAGNOSIS — Z8619 Personal history of other infectious and parasitic diseases: Secondary | ICD-10-CM | POA: Insufficient documentation

## 2016-05-15 LAB — URINALYSIS, ROUTINE W REFLEX MICROSCOPIC
BILIRUBIN URINE: NEGATIVE
Glucose, UA: NEGATIVE mg/dL
Hgb urine dipstick: NEGATIVE
KETONES UR: NEGATIVE mg/dL
NITRITE: NEGATIVE
PROTEIN: NEGATIVE mg/dL
Specific Gravity, Urine: 1.023 (ref 1.005–1.030)
pH: 6 (ref 5.0–8.0)

## 2016-05-15 LAB — URINE MICROSCOPIC-ADD ON

## 2016-05-15 LAB — CBC
HCT: 36.5 % (ref 36.0–46.0)
HEMOGLOBIN: 12.3 g/dL (ref 12.0–15.0)
MCH: 30.3 pg (ref 26.0–34.0)
MCHC: 33.7 g/dL (ref 30.0–36.0)
MCV: 89.9 fL (ref 78.0–100.0)
Platelets: 193 10*3/uL (ref 150–400)
RBC: 4.06 MIL/uL (ref 3.87–5.11)
RDW: 13.2 % (ref 11.5–15.5)
WBC: 10.2 10*3/uL (ref 4.0–10.5)

## 2016-05-15 LAB — COMPREHENSIVE METABOLIC PANEL
ALBUMIN: 3.1 g/dL — AB (ref 3.5–5.0)
ALK PHOS: 75 U/L (ref 38–126)
ALT: 12 U/L — ABNORMAL LOW (ref 14–54)
ANION GAP: 7 (ref 5–15)
AST: 14 U/L — ABNORMAL LOW (ref 15–41)
BILIRUBIN TOTAL: 0.4 mg/dL (ref 0.3–1.2)
BUN: 5 mg/dL — AB (ref 6–20)
CALCIUM: 8.9 mg/dL (ref 8.9–10.3)
CO2: 23 mmol/L (ref 22–32)
Chloride: 107 mmol/L (ref 101–111)
Creatinine, Ser: 0.47 mg/dL (ref 0.44–1.00)
GFR calc Af Amer: >60 mL/min (ref >60)
GLUCOSE: 93 mg/dL (ref 65–99)
Potassium: 3.2 mmol/L — ABNORMAL LOW (ref 3.5–5.1)
Sodium: 137 mmol/L (ref 135–145)
TOTAL PROTEIN: 6.4 g/dL — AB (ref 6.5–8.1)

## 2016-05-15 LAB — LIPASE, BLOOD: Lipase: 23 U/L (ref 11–51)

## 2016-05-15 LAB — HCG, QUANTITATIVE, PREGNANCY: HCG, BETA CHAIN, QUANT, S: 53951 m[IU]/mL — AB (ref <5)

## 2016-05-15 MED ORDER — SODIUM CHLORIDE 0.9 % IV BOLUS (SEPSIS)
1000.0000 mL | Freq: Once | INTRAVENOUS | Status: AC
  Administered 2016-05-15: 1000 mL via INTRAVENOUS

## 2016-05-15 NOTE — ED Provider Notes (Signed)
CSN: [id number]     Arrival date & time 05/15/16  2215 History  By signing my name below, I, Bethel BornBritney Richards, attest that this documentation has been prepared under the direction and in the presence of Shon Batonourtney F Horton, MD. Electronically Signed: Bethel BornBritney Richards, ED Scribe. 05/15/2016. 11:31 PM   Chief Complaint  Patient presents with  . Abdominal Pain   The history is provided by the patient. No language interpreter was used.   [name] Richards is a 20 y.o. female who presents to the Emergency Department complaining of constant, non-radiating, 8/10 in severity, lower left abdominal pain with onset 3-4 days ago. The pt is pregnant but has had no prenatal care. She has noted decreased fetal movement for the last 3 weeks. G2P1. The pt has an 408 month old baby at home. Her LMP was in September 2016. Her menstrual periods are chronically irregular. Pt denies vaginal bleeding and leakage of fluid.   Of note, patient [facility] in February of this year and found to have a beta hCG of approximately 850 with an inconclusive ultrasound but no IUP at that time.  Past Medical History  Diagnosis Date  . Medical history non-contributory   . Herpes    Past Surgical History  Procedure Laterality Date  . Excision vaginal cyst     Family History  Problem Relation Age of Onset  . Asthma Father   . Diabetes Maternal Grandmother   . Diabetes Maternal Grandfather    Social History  Substance Use Topics  . Smoking status: Never Smoker   . Smokeless tobacco: Never Used  . Alcohol Use: No   OB History    Gravida Para Term Preterm AB TAB SAB Ectopic Multiple Living   2 1 1       0 1     Review of Systems  Constitutional: Negative for fever.  Gastrointestinal: Positive for abdominal pain. Negative for nausea and vomiting.  Genitourinary: Negative for vaginal bleeding and vaginal discharge.  All other systems reviewed and are negative.  Allergies  Review of patient's allergies  indicates no known allergies.  Home Medications   Prior to Admission medications   Medication Sig Start Date End Date Taking? Authorizing Provider  metoCLOPramide (REGLAN) 10 MG tablet Take 1 tablet (10 mg total) by mouth 4 (four) times daily -  before meals and at bedtime. Patient not taking: Reported on 05/16/2016 04/08/16   Duane LopeJennifer I Rasch, NP  Prenatal Multivit-Min-Fe-FA (PRENATAL VITAMINS) 0.8 MG tablet Take 1 tablet by mouth daily. 05/16/16   Shon Batonourtney F Horton, MD   BP 118/82 mmHg  Pulse 108  Temp(Src) 98.7 F (37.1 C) (Oral)  Resp 18  SpO2 100%  LMP 12/23/2015 Physical Exam  Constitutional: She is oriented to person, place, and time. No distress.  Anxious appearing  HENT:  Head: Normocephalic and atraumatic.  Cardiovascular: Normal rate, regular rhythm and normal heart sounds.   No murmur heard. Pulmonary/Chest: Effort normal and breath sounds normal. No respiratory distress. She has no wheezes.  Abdominal: Soft. Bowel sounds are normal. There is tenderness. There is no rebound and no guarding.  Uterine fundus palpated below the umbilicus, mild tenderness to palpation left lower quadrant without rebound or guarding  Neurological: She is alert and oriented to person, place, and time.  Skin: Skin is warm and dry.  Psychiatric: She has a normal mood and affect.  Nursing note and vitals reviewed.   ED Course  Procedures (including critical care time)  EMERGENCY DEPARTMENT  [place] PREGNANCY "Study: Limited Ultrasound of the Pelvis"  INDICATIONS:Pregnancy(required) Multiple views of the uterus and pelvic cavity are obtained with a multi-frequency probe.  APPROACH:Transabdominal   PERFORMED BY: Myself  IMAGES ARCHIVED?: Yes  LIMITATIONS: Emergent procedure  PREGNANCY FREE FLUID: None   PREGNANCY FINDINGS: Fetal heart activity seen  INTERPRETATION: Viable intrauterine pregnancy  GESTATIONAL AGE, ESTIMATE: 17-18 weeks  FETAL HEART RATE: 136  COMMENT(Estimate of  Gestational Age):  Estimated gestational age 16-18 weeks based on BPD. Good fetal movement and heart activity seen.    DIAGNOSTIC STUDIES: Oxygen Saturation is 100% on RA,  normal by my interpretation.    COORDINATION OF CARE: 11:19 PM Discussed treatment plan which includes lab work, [place], and IVF with pt at bedside and pt agreed to plan.  Labs Review Labs Reviewed  WET PREP, GENITAL - Abnormal; Notable for the following:    Clue Cells Wet Prep HPF POC PRESENT (*)    WBC, Wet Prep HPF POC MANY (*)    All other components within normal limits  COMPREHENSIVE METABOLIC PANEL - Abnormal; Notable for the following:    Potassium 3.2 (*)    BUN 5 (*)    Total Protein 6.4 (*)    Albumin 3.1 (*)    AST 14 (*)    ALT 12 (*)    All other components within normal limits  URINALYSIS, ROUTINE W REFLEX MICROSCOPIC (NOT AT [facility]) - Abnormal; Notable for the following:    APPearance CLOUDY (*)    Leukocytes, UA SMALL (*)    All other components within normal limits  HCG, QUANTITATIVE, PREGNANCY - Abnormal; Notable for the following:    hCG, Beta Chain, Quant, S 53951 (*)    All other components within normal limits  URINE MICROSCOPIC-ADD ON - Abnormal; Notable for the following:    Squamous Epithelial / LPF 0-5 (*)    Bacteria, UA MANY (*)    All other components within normal limits  LIPASE, BLOOD  CBC  HIV ANTIBODY (ROUTINE TESTING)  GC/CHLAMYDIA PROBE AMP ([place]) NOT AT [facility]    Imaging Review US Ob Limited  05/16/2016  CLINICAL DATA:  Acute onset of generalized abdominal pain and lack of fetal movement. Initial encounter. EXAM: LIMITED OBSTETRIC ULTRASOUND FINDINGS: Number of Fetuses:  1 Heart Rate:  121 bpm Movement:  Yes Presentation: Variable Placental Location: Anterior Previa: Marginal, extending to the edge of the internal cervical os. Amniotic Fluid (Subjective):  Within normal limits. BPD:  3.7 cm 17 w 1 d MATERNAL FINDINGS: Cervix:  Appears closed. Uterus/Adnexae:  No  abnormality visualized. IMPRESSION: 1. Single live intrauterine pregnancy noted, with a biparietal diameter of 3.7 cm, corresponding to a gestational age of [redacted] weeks 1 day. This does not match the gestational age by LMP, and reflects a new estimated date of delivery of October 23, 2016. The cervix remains closed. 2. Marginal placenta previa, with the placenta extending to the edge of the internal cervical os. This will likely migrate superiorly with time. This exam is performed on an emergent basis and does not comprehensively evaluate fetal size, dating, or anatomy; follow-up complete OB US should be considered if further fetal assessment is warranted. Electronically Signed   By: Roanna Raider M.D.   On: 05/16/2016 01:42   I have personally reviewed and evaluated these images and lab results as part of my medical decision-making.   EKG Interpretation None      MDM   Final diagnoses:  Abdominal pain  Second trimester pregnancy  Placenta previa, second trimester    Patient presents with decreased fetal movement and left lower quadrant abdominal pain. No loss of fluid or bleeding. Nontoxic on exam. Tender without signs of peritonitis. Bedside ultrasound shows likely a second trimester pregnancy with good fetal heart rate and movement. Formal ultrasound obtained consistent with a 17 week 1 day pregnancy. They also noted a marginal placenta previa. Patient is not currently having any vaginal bleeding. Discussed with patient that she needs close OB follow-up. She scheduled for an appointment the first week of June. She was given strict return precautions.  After history, exam, and medical workup I feel the patient has been appropriately medically screened and is safe for discharge home. Pertinent diagnoses were discussed with the patient. Patient was given return precautions.   I personally performed the services described in this documentation, which was scribed in my presence. The recorded  information has been reviewed and is accurate.   Shon Baton, MD 05/16/16 [phone]

## 2016-05-15 NOTE — ED Notes (Signed)
Pt. seen and examined by rapid response OB nurse .

## 2016-05-15 NOTE — ED Notes (Signed)
The pt is eight months pregnant  No prenatal care.  lmp sept 2016  She has had abd pain for 2 days and she has not felt the baby move for 2 days  This is her second child

## 2016-05-16 LAB — WET PREP, GENITAL
SPERM: NONE SEEN
Trich, Wet Prep: NONE SEEN
Yeast Wet Prep HPF POC: NONE SEEN

## 2016-05-16 LAB — HIV ANTIBODY (ROUTINE TESTING W REFLEX): HIV SCREEN 4TH GENERATION: NONREACTIVE

## 2016-05-16 LAB — GC/CHLAMYDIA PROBE AMP (~~LOC~~) NOT AT ARMC
CHLAMYDIA, DNA PROBE: NEGATIVE
Neisseria Gonorrhea: NEGATIVE

## 2016-05-16 MED ORDER — PRENATAL VITAMINS 0.8 MG PO TABS: 1.0000 | ORAL_TABLET | Freq: Every day | ORAL | Status: DC

## 2016-05-16 NOTE — ED Notes (Signed)
Patient transported to Ultrasound 

## 2016-05-16 NOTE — Progress Notes (Signed)
[facility] called about patient's arrival [facility] ED with complaints of no fetal movement for 2 weeks and abdominal pain for 3 days in the lower left quadrant that remains constant; patient states she is 8 months pregnant and her last LMP was September 10th, patient is a G2P271 with an 428 month old at home; patient states her menstrual cycles have been very irregular; she states she has not had any prenatal care due to transportation issues and the FOB being out of work due to an injury; EFM attempted and movement palpated; bedside ultrasound performed by Dr Wilkie AyeHorton and FHR was 135; OB ultrasound ordered and being performed at this time

## 2016-05-16 NOTE — Discharge Instructions (Signed)
You were seen today for abdominal pain during pregnancy. You are approximally 17 weeks and 1 day pregnant with an estimated date of delivery of 10/23/2016.  Your placenta is lying over your cervix at this time. This will likely change over time however, if you develop bleeding or worsening pain you need to be seen immediately. Follow-up with OB as scheduled.  Placenta Previa Placenta previa is a condition in pregnant women where the placenta implants in the lower part of the uterus. The placenta either partially or completely covers the opening to the cervix. This is a problem because the baby must pass through the cervix during delivery. There are three types of placenta previa. They include:  1. Marginal placenta previa. The placenta is near the cervix, but does not cover the opening. 2. Partial placenta previa. The placenta covers part of the cervical opening. 3. Complete placenta previa. The placenta covers the entire cervical opening.  Depending on the type of placenta previa, there is a chance the placenta may move into a normal position and no longer cover the cervix as the pregnancy progresses. It is important to keep all prenatal visits with your caregiver.  RISK FACTORS You may be more likely to develop placenta previa if you:   Are carrying more than one baby (multiples).   Have an abnormally shaped uterus.   Have scars on the lining of the uterus.   Had previous surgeries involving the uterus, such as a cesarean delivery.   Have delivered a baby previously.   Have a history of placenta previa.   Have smoked or used cocaine during pregnancy.   Are age 21 or older during pregnancy.  SYMPTOMS The main symptom of placenta previa is sudden, painless vaginal bleeding during the second half of pregnancy. The amount of bleeding can be light to very heavy. The bleeding may stop on its own, but almost always returns. Cramping, regular contractions, abdominal pain, and lower back  pain can also occur with placenta previa.  DIAGNOSIS Placenta previa can be diagnosed through an ultrasound by finding where the placenta is located. The ultrasound may find placenta previa either during a routine prenatal visit or after vaginal bleeding is noticed. If you are diagnosed with placenta previa, your caregiver may avoid vaginal exams to reduce the risk of heavy bleeding. There is a chance that placenta previa may not be diagnosed until bleeding occurs during labor.  TREATMENT Specific treatment depends on:   How much you are bleeding or if the bleeding has stopped.  How far along you are in your pregnancy.   The condition of the baby.   The location of the baby and placenta.   The type of placenta previa.  Depending on the factors above, your caregiver may recommend:   Decreased activity.   Bed rest at home or in the hospital.  Pelvic rest. This means no sex, using tampons, douching, pelvic exams, or placing anything into the vagina.  A blood transfusion to replace maternal blood loss.  A cesarean delivery if the bleeding is heavy and cannot be controlled or the placenta completely covers the cervix.  Medication to stop premature labor or mature the fetal lungs if delivery is needed before the pregnancy is full term.  WHEN SHOULD YOU SEEK IMMEDIATE MEDICAL CARE IF YOU ARE SENT HOME WITH PLACENTA PREVIA? Seek immediate medical care if you show any symptoms of placenta previa. You will need to go to the hospital to get checked immediately. Again, those symptoms are:  Sudden, painless vaginal bleeding, even a small amount.  Cramping or regular contractions.  Lower back or abdominal pain.   This information is not intended to replace advice given to you by your health care provider. Make sure you discuss any questions you have with your health care provider.   Document Released: 12/11/2005 Document Revised: 01/01/2015 Document Reviewed: 03/14/2013 Elsevier  Interactive Patient Education 2016 Elsevier Inc. Abdominal Pain During Pregnancy Abdominal pain is common in pregnancy. Most of the time, it does not cause harm. There are many causes of abdominal pain. Some causes are more serious than others. Some of the causes of abdominal pain in pregnancy are easily diagnosed. Occasionally, the diagnosis takes time to understand. Other times, the cause is not determined. Abdominal pain can be a sign that something is very wrong with the pregnancy, or the pain may have nothing to do with the pregnancy at all. For this reason, always tell your health care provider if you have any abdominal discomfort. HOME CARE INSTRUCTIONS  Monitor your abdominal pain for any changes. The following actions may help to alleviate any discomfort you are experiencing: 4. Do not have sexual intercourse or put anything in your vagina until your symptoms go away completely. 5. Get plenty of rest until your pain improves. 6. Drink clear fluids if you feel nauseous. Avoid solid food as long as you are uncomfortable or nauseous. 7. Only take over-the-counter or prescription medicine as directed by your health care provider. 8. Keep all follow-up appointments with your health care provider. SEEK IMMEDIATE MEDICAL CARE IF:  You are bleeding, leaking fluid, or passing tissue from the vagina.  You have increasing pain or cramping.  You have persistent vomiting.  You have painful or bloody urination.  You have a fever.  You notice a decrease in your baby's movements.  You have extreme weakness or feel faint.  You have shortness of breath, with or without abdominal pain.  You develop a severe headache with abdominal pain.  You have abnormal vaginal discharge with abdominal pain.  You have persistent diarrhea.  You have abdominal pain that continues even after rest, or gets worse. MAKE SURE YOU:   Understand these instructions.  Will watch your condition.  Will get help  right away if you are not doing well or get worse.   This information is not intended to replace advice given to you by your health care provider. Make sure you discuss any questions you have with your health care provider.   Document Released: 12/11/2005 Document Revised: 10/01/2013 Document Reviewed: 07/10/2013 Elsevier Interactive Patient Education [employer] Elsevier Inc.

## 2016-05-17 ENCOUNTER — Encounter (HOSPITAL_COMMUNITY): Payer: Self-pay | Admitting: *Deleted

## 2016-05-17 ENCOUNTER — Inpatient Hospital Stay (HOSPITAL_COMMUNITY)
Admission: AD | Admit: 2016-05-17 | Discharge: 2016-05-17 | Disposition: A | Discharge status: Home / Self Care | Payer: Self-pay | Source: Ambulatory Visit | Attending: Obstetrics & Gynecology | Admitting: Obstetrics & Gynecology

## 2016-05-17 DIAGNOSIS — J00 Acute nasopharyngitis [common cold]: Secondary | ICD-10-CM | POA: Insufficient documentation

## 2016-05-17 DIAGNOSIS — Z3A17 17 weeks gestation of pregnancy: Secondary | ICD-10-CM | POA: Insufficient documentation

## 2016-05-17 DIAGNOSIS — O99512 Diseases of the respiratory system complicating pregnancy, second trimester: Secondary | ICD-10-CM | POA: Insufficient documentation

## 2016-05-17 DIAGNOSIS — O2342 Unspecified infection of urinary tract in pregnancy, second trimester: Secondary | ICD-10-CM | POA: Insufficient documentation

## 2016-05-17 LAB — URINALYSIS, ROUTINE W REFLEX MICROSCOPIC
BILIRUBIN URINE: NEGATIVE
GLUCOSE, UA: NEGATIVE mg/dL
Ketones, ur: NEGATIVE mg/dL
Nitrite: NEGATIVE
PROTEIN: NEGATIVE mg/dL
Specific Gravity, Urine: 1.02 (ref 1.005–1.030)
pH: 6.5 (ref 5.0–8.0)

## 2016-05-17 LAB — URINE MICROSCOPIC-ADD ON

## 2016-05-17 MED ORDER — FLUTICASONE PROPIONATE 50 MCG/ACT NA SUSP: 1.0000 | Freq: Every day | NASAL | Status: DC

## 2016-05-17 MED ORDER — NITROFURANTOIN MONOHYD MACRO 100 MG PO CAPS
100.0000 mg | ORAL_CAPSULE | Freq: Two times a day (BID) | ORAL | Status: DC
Start: 2016-05-17 — End: 2016-05-20

## 2016-05-17 NOTE — MAU Provider Note (Signed)
History     CSN: [id number]  Arrival date and time: 05/17/16 1431 First Provider Initiated Contact with Patient 05/17/16 1539      Chief Complaint  Patient presents with  . Urinary Tract Infection  . Chills   HPI Comments: G2P1001 @17wks  c/o dysuria, polyuria, urgency, and hematuria since yesterday. Denies fever, back pain, nausea, or vomiting. Seen in ED 2 days ago, told +UTI but not treated. Also c/o nasal congestion, and productive cough x1 wk. Reports sx are improving but persist. No SOB. Denies VB, LOF, cramping, & abd pain. P[facility] not yet established, [facility]   Urinary Tract Infection  Associated symptoms include frequency, hematuria and urgency. Pertinent negatives include no chills or flank pain.    OB History    Gravida Para Term Preterm AB TAB SAB Ectopic Multiple Living   2 1 1       0 1      Past Medical History  Diagnosis Date  . Medical history non-contributory   . Herpes     Past Surgical History  Procedure Laterality Date  . Excision vaginal cyst      Family History  Problem Relation Age of Onset  . Asthma Father   . Diabetes Maternal Grandmother   . Diabetes Maternal Grandfather     Social History  Substance Use Topics  . Smoking status: Never Smoker   . Smokeless tobacco: Never Used  . Alcohol Use: No    Allergies: No Known Allergies  Prescriptions prior to admission  Medication Sig Dispense Refill Last Dose  . Prenatal Multivit-Min-Fe-FA (PRENATAL VITAMINS) 0.8 MG tablet Take 1 tablet by mouth daily. 30 tablet 12 05/17/2016 at Unknown time  . metoCLOPramide (REGLAN) 10 MG tablet Take 1 tablet (10 mg total) by mouth 4 (four) times daily -  before meals and at bedtime. (Patient not taking: Reported on 05/17/2016) 90 tablet 2 Not Taking at Unknown time    Review of Systems  Constitutional: Negative for fever and chills.  HENT: Positive for congestion.   Eyes: Negative.   Respiratory: Positive for cough and sputum  production. Negative for hemoptysis, shortness of breath and wheezing.   Cardiovascular: Negative.   Gastrointestinal: Negative.   Genitourinary: Positive for dysuria, urgency, frequency and hematuria. Negative for flank pain.  Musculoskeletal: Negative.   Skin: Negative.   Neurological: Negative.   Endo/Heme/Allergies: Negative.   Psychiatric/Behavioral: Negative.    Physical Exam   Blood pressure 100/58, pulse 96, temperature 98.3 F (36.8 C), resp. rate 18, last menstrual period 12/23/2015, not currently breastfeeding.  Physical Exam  Constitutional: She is oriented to person, place, and time. She appears well-developed and well-nourished.  HENT:  Head: Normocephalic and atraumatic.  Breathe right stip to nose  Eyes: Pupils are equal, round, and reactive to light.  Neck: Normal range of motion. Neck supple.  Cardiovascular: Normal rate and regular rhythm.   Respiratory: Effort normal and breath sounds normal.  GI: Soft. Bowel sounds are normal.  Genitourinary:  Neg CVAT  Musculoskeletal: Normal range of motion.  Neurological: She is alert and oriented to person, place, and time.  Skin: Skin is warm and dry.  Psychiatric: She has a normal mood and affect.  FHT 155 by doppler  Results for orders placed or performed during the hospital encounter of 05/17/16 (from the past 24 hour(s))  Urinalysis, Routine w reflex microscopic ([facility])     Status: Abnormal   Collection Time: 05/17/16  2:35 PM  Result Value  Ref Range   Color, Urine YELLOW YELLOW   APPearance CLOUDY (A) CLEAR   Specific Gravity, Urine 1.020 1.005 - 1.030   pH 6.5 5.0 - 8.0   Glucose, UA NEGATIVE NEGATIVE mg/dL   Hgb urine dipstick LARGE (A) NEGATIVE   Bilirubin Urine NEGATIVE NEGATIVE   Ketones, ur NEGATIVE NEGATIVE mg/dL   Protein, ur NEGATIVE NEGATIVE mg/dL   Nitrite NEGATIVE NEGATIVE   Leukocytes, UA MODERATE (A) NEGATIVE  Urine microscopic-add on     Status: Abnormal   Collection Time: 05/17/16   2:35 PM  Result Value Ref Range   Squamous Epithelial / LPF 6-30 (A) NONE SEEN   WBC, UA TOO NUMEROUS TO COUNT 0 - 5 WBC/hpf   RBC / HPF 6-30 0 - 5 RBC/hpf   Bacteria, UA MANY (A) NONE SEEN    MAU Course  Procedures  MDM Labs reviewed. Findings consistent with UTI and nasopharyngitis. Pt stable at time of discharge.   Assessment and Plan   1. UTI in pregnancy, antepartum, second trimester   2. Nasopharyngitis    Discharge home Mucinex OTC bid Robitussin OTC q6 hrs Macrobid 100 mg po bid x7 days #14, no refill Flonase 1 spray daily #1 bottle, refill x2 Increase po fluids, may add cranberry juice bid Call or return for worsening sx Follow up as scheduled next week in [facility] 05/17/2016, 4:10 PM

## 2016-05-17 NOTE — MAU Note (Addendum)
Pt presents to MAU for UTI symptoms and cold for a week. PT denies any vaginal bleeding or abnormal discharge. Pt states she went to the ER  yesterday and was told she had a UTI but was not given medications for it and also was told by U/S that she had a placenta previa

## 2016-05-17 NOTE — Discharge Instructions (Signed)
Upper Respiratory Infection, Adult Most upper respiratory infections (URIs) are a viral infection of the air passages leading to the lungs. A URI affects the nose, throat, and upper air passages. The most common type of URI is nasopharyngitis and is typically referred to as "the common cold." URIs run their course and usually go away on their own. Most of the time, a URI does not require medical attention, but sometimes a bacterial infection in the upper airways can follow a viral infection. This is called a secondary infection. Sinus and middle ear infections are common types of secondary upper respiratory infections. Bacterial pneumonia can also complicate a URI. A URI can worsen asthma and chronic obstructive pulmonary disease (COPD). Sometimes, these complications can require emergency medical care and may be life threatening.  CAUSES Almost all URIs are caused by viruses. A virus is a type of germ and can spread from one person to another.  RISKS FACTORS You may be at risk for a URI if:   You smoke.   You have chronic heart or lung disease.  You have a weakened defense (immune) system.   You are very young or very old.   You have nasal allergies or asthma.  You work in crowded or poorly ventilated areas.  You work in health care facilities or schools. SIGNS AND SYMPTOMS  Symptoms typically develop 2-3 days after you come in contact with a cold virus. Most viral URIs last 7-10 days. However, viral URIs from the influenza virus (flu virus) can last 14-18 days and are typically more severe. Symptoms may include:   Runny or stuffy (congested) nose.   Sneezing.   Cough.   Sore throat.   Headache.   Fatigue.   Fever.   Loss of appetite.   Pain in your forehead, behind your eyes, and over your cheekbones (sinus pain).  Muscle aches.  DIAGNOSIS  Your health care provider may diagnose a URI by:  Physical exam.  Tests to check that your symptoms are not due to  another condition such as:  Strep throat.  Sinusitis.  Pneumonia.  Asthma. TREATMENT  A URI goes away on its own with time. It cannot be cured with medicines, but medicines may be prescribed or recommended to relieve symptoms. Medicines may help:  Reduce your fever.  Reduce your cough.  Relieve nasal congestion. HOME CARE INSTRUCTIONS   Take medicines only as directed by your health care provider.   Gargle warm saltwater or take cough drops to comfort your throat as directed by your health care provider.  Use a warm mist humidifier or inhale steam from a shower to increase air moisture. This may make it easier to breathe.  Drink enough fluid to keep your urine clear or pale yellow.   Eat soups and other clear broths and maintain good nutrition.   Rest as needed.   Return to work when your temperature has returned to normal or as your health care provider advises. You may need to stay home longer to avoid infecting others. You can also use a face mask and careful hand washing to prevent spread of the virus.  Increase the usage of your inhaler if you have asthma.   Do not use any tobacco products, including cigarettes, chewing tobacco, or electronic cigarettes. If you need help quitting, ask your health care provider. PREVENTION  The best way to protect yourself from getting a cold is to practice good hygiene.   Avoid oral or hand contact with people with cold  symptoms.   Wash your hands often if contact occurs.  There is no clear evidence that vitamin C, vitamin E, echinacea, or exercise reduces the chance of developing a cold. However, it is always recommended to get plenty of rest, exercise, and practice good nutrition.  SEEK MEDICAL CARE IF:   You are getting worse rather than better.   Your symptoms are not controlled by medicine.   You have chills.  You have worsening shortness of breath.  You have brown or red mucus.  You have yellow or brown nasal  discharge.  You have pain in your face, especially when you bend forward.  You have a fever.  You have swollen neck glands.  You have pain while swallowing.  You have white areas in the back of your throat. SEEK IMMEDIATE MEDICAL CARE IF:   You have severe or persistent:  Headache.  Ear pain.  Sinus pain.  Chest pain.  You have chronic lung disease and any of the following:  Wheezing.  Prolonged cough.  Coughing up blood.  A change in your usual mucus.  You have a stiff neck.  You have changes in your:  Vision.  Hearing.  Thinking.  Mood. MAKE SURE YOU:   Understand these instructions.  Will watch your condition.  Will get help right away if you are not doing well or get worse.   This information is not intended to replace advice given to you by your health care provider. Make sure you discuss any questions you have with your health care provider.   Document Released: 06/06/2001 Document Revised: 04/27/2015 Document Reviewed: 03/18/2014 Elsevier Interactive Patient Education 2016 Elsevier Inc. Pregnancy and Urinary Tract Infection A urinary tract infection (UTI) is a bacterial infection of the urinary tract. Infection of the urinary tract can include the ureters, kidneys (pyelonephritis), bladder (cystitis), and urethra (urethritis). All pregnant women should be screened for bacteria in the urinary tract. Identifying and treating a UTI will decrease the risk of preterm labor and developing more serious infections in both the mother and baby. CAUSES Bacteria germs cause almost all UTIs.  RISK FACTORS Many factors can increase your chances of getting a UTI during pregnancy. These include:  Having a short urethra.  Poor toilet and hygiene habits.  Sexual intercourse.  Blockage of urine along the urinary tract.  Problems with the pelvic muscles or nerves.  Diabetes.  Obesity.  Bladder problems after having several children.  Previous history  of UTI. SIGNS AND SYMPTOMS   Pain, burning, or a stinging feeling when urinating.  Suddenly feeling the need to urinate right away (urgency).  Loss of bladder control (urinary incontinence).  Frequent urination, more than is common with pregnancy.  Lower abdominal or back discomfort.  Cloudy urine.  Blood in the urine (hematuria).  Fever. When the kidneys are infected, the symptoms may be:  Back pain.  Flank pain on the right side more so than the left.  Fever.  Chills.  Nausea.  Vomiting. DIAGNOSIS  A urinary tract infection is usually diagnosed through urine tests. Additional tests and procedures are sometimes done. These may include:  Ultrasound exam of the kidneys, ureters, bladder, and urethra.  Looking in the bladder with a lighted tube (cystoscopy). TREATMENT Typically, UTIs can be treated with antibiotic medicines.  HOME CARE INSTRUCTIONS   Only take over-the-counter or prescription medicines as directed by your health care provider. If you were prescribed antibiotics, take them as directed. Finish them even if you start to feel better.  Drink enough fluids to keep your urine clear or pale yellow.  Do not have sexual intercourse until the infection is gone and your health care provider says it is okay.  Make sure you are tested for UTIs throughout your pregnancy. These infections often come back. Preventing a UTI in the Future  Practice good toilet habits. Always wipe from front to back. Use the tissue only once.  Do not hold your urine. Empty your bladder as soon as possible when the urge comes.  Do not douche or use deodorant sprays.  Wash with soap and warm water around the genital area and the anus.  Empty your bladder before and after sexual intercourse.  Wear underwear with a cotton crotch.  Avoid caffeine and carbonated drinks. They can irritate the bladder.  Drink cranberry juice or take cranberry pills. This may decrease the risk of  getting a UTI.  Do not drink alcohol.  Keep all your appointments and tests as scheduled. SEEK MEDICAL CARE IF:   Your symptoms get worse.  You are still having fevers 2 or more days after treatment begins.  You have a rash.  You feel that you are having problems with medicines prescribed.  You have abnormal vaginal discharge. SEEK IMMEDIATE MEDICAL CARE IF:   You have back or flank pain.  You have chills.  You have blood in your urine.  You have nausea and vomiting.  You have contractions of your uterus.  You have a gush of fluid from the vagina. MAKE SURE YOU:  Understand these instructions.   Will watch your condition.   Will get help right away if you are not doing well or get worse.    This information is not intended to replace advice given to you by your health care provider. Make sure you discuss any questions you have with your health care provider.   Document Released: 04/07/2011 Document Revised: 10/01/2013 Document Reviewed: 07/10/2013 Elsevier Interactive Patient Education [employer] Inc.

## 2016-05-19 ENCOUNTER — Inpatient Hospital Stay (HOSPITAL_COMMUNITY)
Admission: AD | Admit: 2016-05-19 | Discharge: 2016-05-20 | Disposition: A | Discharge status: Home / Self Care | Payer: Self-pay | Source: Ambulatory Visit | Attending: Family Medicine | Admitting: Family Medicine

## 2016-05-19 DIAGNOSIS — S3991XA Unspecified injury of abdomen, initial encounter: Secondary | ICD-10-CM

## 2016-05-19 DIAGNOSIS — O9989 Other specified diseases and conditions complicating pregnancy, childbirth and the puerperium: Secondary | ICD-10-CM

## 2016-05-19 DIAGNOSIS — R109 Unspecified abdominal pain: Secondary | ICD-10-CM | POA: Insufficient documentation

## 2016-05-19 DIAGNOSIS — O99891 Dorsalgia, unspecified: Secondary | ICD-10-CM

## 2016-05-19 DIAGNOSIS — O2342 Unspecified infection of urinary tract in pregnancy, second trimester: Secondary | ICD-10-CM

## 2016-05-19 DIAGNOSIS — O9A219 Injury, poisoning and certain other consequences of external causes complicating pregnancy, unspecified trimester: Secondary | ICD-10-CM

## 2016-05-19 DIAGNOSIS — O26899 Other specified pregnancy related conditions, unspecified trimester: Secondary | ICD-10-CM

## 2016-05-19 DIAGNOSIS — M549 Dorsalgia, unspecified: Secondary | ICD-10-CM | POA: Insufficient documentation

## 2016-05-19 DIAGNOSIS — Z3A17 17 weeks gestation of pregnancy: Secondary | ICD-10-CM

## 2016-05-19 DIAGNOSIS — O26892 Other specified pregnancy related conditions, second trimester: Secondary | ICD-10-CM | POA: Insufficient documentation

## 2016-05-19 DIAGNOSIS — W501XXA Accidental kick by another person, initial encounter: Secondary | ICD-10-CM | POA: Insufficient documentation

## 2016-05-19 NOTE — MAU Note (Signed)
Treated couple days ago for uti. Taking medicine but having lower abd pain and back pain. Legs weak. Placenta previa.

## 2016-05-19 NOTE — MAU Note (Addendum)
Treated couple days ago for uti. Taking medicine but having lower abd pain and back pain. Legs weak. Placenta previa. THis am 64month old accidentally kicked me in tummy. Denies LOf or bleeding.

## 2016-05-20 ENCOUNTER — Inpatient Hospital Stay (HOSPITAL_COMMUNITY): Payer: Self-pay

## 2016-05-20 ENCOUNTER — Encounter (HOSPITAL_COMMUNITY): Payer: Self-pay

## 2016-05-20 DIAGNOSIS — O2342 Unspecified infection of urinary tract in pregnancy, second trimester: Secondary | ICD-10-CM

## 2016-05-20 LAB — URINALYSIS, ROUTINE W REFLEX MICROSCOPIC
Bilirubin Urine: NEGATIVE
GLUCOSE, UA: NEGATIVE mg/dL
Hgb urine dipstick: NEGATIVE
KETONES UR: 40 mg/dL — AB
LEUKOCYTES UA: NEGATIVE
NITRITE: NEGATIVE
PH: 5.5 (ref 5.0–8.0)
PROTEIN: NEGATIVE mg/dL
Specific Gravity, Urine: 1.025 (ref 1.005–1.030)

## 2016-05-20 LAB — CBC WITH DIFFERENTIAL/PLATELET
Basophils Absolute: 0 10*3/uL (ref 0.0–0.1)
Basophils Relative: 0 %
EOS ABS: 0.2 10*3/uL (ref 0.0–0.7)
Eosinophils Relative: 2 %
HCT: 34.6 % — ABNORMAL LOW (ref 36.0–46.0)
HEMOGLOBIN: 11.9 g/dL — AB (ref 12.0–15.0)
LYMPHS ABS: 1.5 10*3/uL (ref 0.7–4.0)
Lymphocytes Relative: 14 %
MCH: 31.8 pg (ref 26.0–34.0)
MCHC: 34.4 g/dL (ref 30.0–36.0)
MCV: 92.5 fL (ref 78.0–100.0)
MONO ABS: 1.1 10*3/uL — AB (ref 0.1–1.0)
MONOS PCT: 10 %
NEUTROS PCT: 75 %
Neutro Abs: 8.1 10*3/uL — ABNORMAL HIGH (ref 1.7–7.7)
Platelets: 173 10*3/uL (ref 150–400)
RBC: 3.74 MIL/uL — ABNORMAL LOW (ref 3.87–5.11)
RDW: 13.7 % (ref 11.5–15.5)
WBC: 10.7 10*3/uL — ABNORMAL HIGH (ref 4.0–10.5)

## 2016-05-20 LAB — CULTURE, OB URINE: Culture: >=100000 — AB

## 2016-05-20 MED ORDER — OXYCODONE-ACETAMINOPHEN 5-325 MG PO TABS: 2.0000 | ORAL_TABLET | ORAL | Status: DC | PRN

## 2016-05-20 MED ORDER — OXYCODONE-ACETAMINOPHEN 5-325 MG PO TABS
2.0000 | ORAL_TABLET | Freq: Once | ORAL | Status: AC
  Administered 2016-05-20: 2 via ORAL
  Filled 2016-05-20: qty 2

## 2016-05-20 MED ORDER — CEPHALEXIN 500 MG PO CAPS: 500.0000 mg | ORAL_CAPSULE | Freq: Four times a day (QID) | ORAL | Status: DC

## 2016-05-20 NOTE — Discharge Instructions (Signed)
Back Pain in Pregnancy °Back pain during pregnancy is common. It happens in about half of all pregnancies. It is important for you and your baby that you remain active during your pregnancy. If you feel that back pain is not allowing you to remain active or sleep well, it is time to see your caregiver. Back pain may be caused by several factors related to changes during your pregnancy. Fortunately, unless you had trouble with your back before your pregnancy, the pain is likely to get better after you deliver. °Low back pain usually occurs between the fifth and seventh months of pregnancy. It can, however, happen in the first couple months. Factors that increase the risk of back problems include:  °· Previous back problems. °· Injury to your back. °· Having twins or multiple births. °· A chronic cough. °· Stress. °· Job-related repetitive motions. °· Muscle or spinal disease in the back. °· Family history of back problems, ruptured (herniated) discs, or osteoporosis. °· Depression, anxiety, and panic attacks. °CAUSES  °· When you are pregnant, your body produces a hormone called relaxin. This hormone makes the ligaments connecting the low back and pubic bones more flexible. This flexibility allows the baby to be delivered more easily. When your ligaments are loose, your muscles need to work harder to support your back. Soreness in your back can come from tired muscles. Soreness can also come from back tissues that are irritated since they are receiving less support. °· As the baby grows, it puts pressure on the nerves and blood vessels in your pelvis. This can cause back pain. °· As the baby grows and gets heavier during pregnancy, the uterus pushes the stomach muscles forward and changes your center of gravity. This makes your back muscles work harder to maintain good posture. °SYMPTOMS  °Lumbar pain during pregnancy °Lumbar pain during pregnancy usually occurs at or above the waist in the center of the back. There  may be pain and numbness that radiates into your leg or foot. This is similar to low back pain experienced by non-pregnant women. It usually increases with sitting for long periods of time, standing, or repetitive lifting. Tenderness may also be present in the muscles along your upper back. °Posterior pelvic pain during pregnancy °Pain in the back of the pelvis is more common than lumbar pain in pregnancy. It is a deep pain felt in your side at the waistline, or across the tailbone (sacrum), or in both places. You may have pain on one or both sides. This pain can also go into the buttocks and backs of the upper thighs. Pubic and groin pain may also be present. The pain does not quickly resolve with rest, and morning stiffness may also be present. °Pelvic pain during pregnancy can be brought on by most activities. A high level of fitness before and during pregnancy may or may not prevent this problem. Labor pain is usually 1 to 2 minutes apart, lasts for about 1 minute, and involves a bearing down feeling or pressure in your pelvis. However, if you are at term with the pregnancy, constant low back pain can be the beginning of early labor, and you should be aware of this. °DIAGNOSIS  °X-rays of the back should not be done during the first 12 to 14 weeks of the pregnancy and only when absolutely necessary during the rest of the pregnancy. MRIs do not give off radiation and are safe during pregnancy. MRIs also should only be done when absolutely necessary. °HOME CARE INSTRUCTIONS °· Exercise   as directed by your caregiver. Exercise is the most effective way to prevent or manage back pain. If you have a back problem, it is especially important to avoid sports that require sudden body movements. Swimming and walking are great activities. °· Do not stand in one place for long periods of time. °· Do not wear high heels. °· Sit in chairs with good posture. Use a pillow on your lower back if necessary. Make sure your head  rests over your shoulders and is not hanging forward. °· Try sleeping on your side, preferably the left side, with a pillow or two between your legs. If you are sore after a night's rest, your bed may be too soft. Try placing a board between your mattress and box spring. °· Listen to your body when lifting. If you are experiencing pain, ask for help or try bending your knees more so you can use your leg muscles rather than your back muscles. Squat down when picking up something from the floor. Do not bend over. °· Eat a healthy diet. Try to gain weight within your caregiver's recommendations. °· Use heat or cold packs 3 to 4 times a day for 15 minutes to help with the pain. °· Only take over-the-counter or prescription medicines for pain, discomfort, or fever as directed by your caregiver. °Sudden (acute) back pain °· Use bed rest for only the most extreme, acute episodes of back pain. Prolonged bed rest over 48 hours will aggravate your condition. °· Ice is very effective for acute conditions. °· Put ice in a plastic bag. °· Place a towel between your skin and the bag. °· Leave the ice on for 10 to 20 minutes every 2 hours, or as needed. °· Using heat packs for 30 minutes prior to activities is also helpful. °Continued back pain °See your caregiver if you have continued problems. Your caregiver can help or refer you for appropriate physical therapy. With conditioning, most back problems can be avoided. Sometimes, a more serious issue may be the cause of back pain. You should be seen right away if new problems seem to be developing. Your caregiver may recommend: °· A maternity girdle. °· An elastic sling. °· A back brace. °· A massage therapist or acupuncture. °SEEK MEDICAL CARE IF:  °· You are not able to do most of your daily activities, even when taking the pain medicine you were given. °· You need a referral to a physical therapist or chiropractor. °· You want to try acupuncture. °SEEK IMMEDIATE MEDICAL CARE  IF: °· You develop numbness, tingling, weakness, or problems with the use of your arms or legs. °· You develop severe back pain that is no longer relieved with medicines. °· You have a sudden change in bowel or bladder control. °· You have increasing pain in other areas of the body. °· You develop shortness of breath, dizziness, or fainting. °· You develop nausea, vomiting, or sweating. °· You have back pain which is similar to labor pains. °· You have back pain along with your water breaking or vaginal bleeding. °· You have back pain or numbness that travels down your leg. °· Your back pain developed after you fell. °· You develop pain on one side of your back. You may have a kidney stone. °· You see blood in your urine. You may have a bladder infection or kidney stone. °· You have back pain with blisters. You may have shingles. °Back pain is fairly common during pregnancy but should not be accepted as just part of   the process. Back pain should always be treated as soon as possible. This will make your pregnancy as pleasant as possible.   This information is not intended to replace advice given to you by your health care provider. Make sure you discuss any questions you have with your health care provider.   Document Released: 03/21/2006 Document Revised: 03/04/2012 Document Reviewed: 05/02/2011 Elsevier Interactive Patient Education 2016 Elsevier Inc.  Pregnancy and Urinary Tract Infection A urinary tract infection (UTI) is a bacterial infection of the urinary tract. Infection of the urinary tract can include the ureters, kidneys (pyelonephritis), bladder (cystitis), and urethra (urethritis). All pregnant women should be screened for bacteria in the urinary tract. Identifying and treating a UTI will decrease the risk of preterm labor and developing more serious infections in both the mother and baby. CAUSES Bacteria germs cause almost all UTIs.  RISK FACTORS Many factors can increase your chances of  getting a UTI during pregnancy. These include:  Having a short urethra.  Poor toilet and hygiene habits.  Sexual intercourse.  Blockage of urine along the urinary tract.  Problems with the pelvic muscles or nerves.  Diabetes.  Obesity.  Bladder problems after having several children.  Previous history of UTI. SIGNS AND SYMPTOMS   Pain, burning, or a stinging feeling when urinating.  Suddenly feeling the need to urinate right away (urgency).  Loss of bladder control (urinary incontinence).  Frequent urination, more than is common with pregnancy.  Lower abdominal or back discomfort.  Cloudy urine.  Blood in the urine (hematuria).  Fever. When the kidneys are infected, the symptoms may be:  Back pain.  Flank pain on the right side more so than the left.  Fever.  Chills.  Nausea.  Vomiting. DIAGNOSIS  A urinary tract infection is usually diagnosed through urine tests. Additional tests and procedures are sometimes done. These may include:  Ultrasound exam of the kidneys, ureters, bladder, and urethra.  Looking in the bladder with a lighted tube (cystoscopy). TREATMENT Typically, UTIs can be treated with antibiotic medicines.  HOME CARE INSTRUCTIONS   Only take over-the-counter or prescription medicines as directed by your health care provider. If you were prescribed antibiotics, take them as directed. Finish them even if you start to feel better.  Drink enough fluids to keep your urine clear or pale yellow.  Do not have sexual intercourse until the infection is gone and your health care provider says it is okay.  Make sure you are tested for UTIs throughout your pregnancy. These infections often come back. Preventing a UTI in the Future  Practice good toilet habits. Always wipe from front to back. Use the tissue only once.  Do not hold your urine. Empty your bladder as soon as possible when the urge comes.  Do not douche or use deodorant  sprays.  Wash with soap and warm water around the genital area and the anus.  Empty your bladder before and after sexual intercourse.  Wear underwear with a cotton crotch.  Avoid caffeine and carbonated drinks. They can irritate the bladder.  Drink cranberry juice or take cranberry pills. This may decrease the risk of getting a UTI.  Do not drink alcohol.  Keep all your appointments and tests as scheduled. SEEK MEDICAL CARE IF:   Your symptoms get worse.  You are still having fevers 2 or more days after treatment begins.  You have a rash.  You feel that you are having problems with medicines prescribed.  You have abnormal vaginal discharge.  SEEK IMMEDIATE MEDICAL CARE IF:   You have back or flank pain.  You have chills.  You have blood in your urine.  You have nausea and vomiting.  You have contractions of your uterus.  You have a gush of fluid from the vagina. MAKE SURE YOU:  Understand these instructions.   Will watch your condition.   Will get help right away if you are not doing well or get worse.    This information is not intended to replace advice given to you by your health care provider. Make sure you discuss any questions you have with your health care provider.   Document Released: 04/07/2011 Document Revised: 10/01/2013 Document Reviewed: 07/10/2013 Elsevier Interactive Patient Education [employer]2016 Elsevier Inc.
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
## 2016-05-29 ENCOUNTER — Encounter: Payer: Self-pay | Admitting: Obstetrics and Gynecology

## 2016-07-06 ENCOUNTER — Encounter: Payer: Self-pay | Admitting: Obstetrics and Gynecology

## 2016-07-06 ENCOUNTER — Ambulatory Visit (INDEPENDENT_AMBULATORY_CARE_PROVIDER_SITE_OTHER): Payer: Self-pay | Admitting: Obstetrics and Gynecology

## 2016-07-06 VITALS — BP 100/57 | HR 79 | Wt 133.3 lb

## 2016-07-06 DIAGNOSIS — O09892 Supervision of other high risk pregnancies, second trimester: Secondary | ICD-10-CM

## 2016-07-06 DIAGNOSIS — O4442 Low lying placenta NOS or without hemorrhage, second trimester: Secondary | ICD-10-CM | POA: Insufficient documentation

## 2016-07-06 DIAGNOSIS — O0932 Supervision of pregnancy with insufficient antenatal care, second trimester: Secondary | ICD-10-CM

## 2016-07-06 DIAGNOSIS — Z3402 Encounter for supervision of normal first pregnancy, second trimester: Secondary | ICD-10-CM

## 2016-07-06 DIAGNOSIS — O0930 Supervision of pregnancy with insufficient antenatal care, unspecified trimester: Secondary | ICD-10-CM | POA: Insufficient documentation

## 2016-07-06 DIAGNOSIS — Z3689 Encounter for other specified antenatal screening: Secondary | ICD-10-CM

## 2016-07-06 DIAGNOSIS — Z113 Encounter for screening for infections with a predominantly sexual mode of transmission: Secondary | ICD-10-CM

## 2016-07-06 LAB — POCT URINALYSIS DIP (DEVICE)
Bilirubin Urine: NEGATIVE
GLUCOSE, UA: NEGATIVE mg/dL
Hgb urine dipstick: NEGATIVE
KETONES UR: NEGATIVE mg/dL
Nitrite: NEGATIVE
PROTEIN: NEGATIVE mg/dL
SPECIFIC GRAVITY, URINE: 1.02 (ref 1.005–1.030)
UROBILINOGEN UA: 0.2 mg/dL (ref 0.0–1.0)
pH: 7 (ref 5.0–8.0)

## 2016-07-06 NOTE — Progress Notes (Signed)
New ob packet given   

## 2016-07-06 NOTE — Progress Notes (Signed)
New OB Note  07/06/2016   [facility]  Chief Complaint: New OB  Transfer of Care Patient: no  History of Present Illness: [name] is a 20 y.o. G2P1001 @ 24/4 weeks (EDC 10/29, based on 17wk u/s).  Patient's last menstrual period was 12/23/2015.). Preg c/b close interval pregnancy, h/o HSV, LL placenta  She was using no method when she conceived.  She has Negative signs or symptoms of nausea/vomiting of pregnancy. She has Negative signs or symptoms of miscarriage or preterm labor  ROS: A 12-point review of systems was performed and negative, except as stated in the above HPI.  OBGYN History: As per HPI. OB History  Gravida Para Term Preterm AB SAB TAB Ectopic Multiple Living  2 1 1       0 1    # Outcome Date GA Lbr Len/2nd Weight Sex Delivery Anes PTL Lv  2 Current           1 Term 09/04/15 2261w3d 12:45 / 00:14 6 lb 10.4 oz (3.015 kg) M Vag-Spont EPI  Y      Any prior children are healthy, doing well, without any problems or issues: yes; had some meconium aspiration issues PP.  History of pap smears: No.  History of STIs: HSV   Past Medical History: Past Medical History  Diagnosis Date  . Medical history non-contributory   . Herpes     Past Surgical History: Past Surgical History  Procedure Laterality Date  . Excision vaginal cyst      Family History:  Family History  Problem Relation Age of Onset  . Asthma Father   . Diabetes Maternal Grandmother   . Diabetes Maternal Grandfather    She denies any history of mental retardation, birth defects or genetic disorders in her or the FOB's history  Social History:  Social History   Social History  . Marital Status: Single    Spouse Name: N/A  . Number of Children: N/A  . Years of Education: N/A   Occupational History  . Not on file.   Social History Main Topics  . Smoking status: Never Smoker   . Smokeless tobacco: Never Used  . Alcohol Use: No  . Drug Use: No  . Sexual  Activity: Yes    Birth Control/ Protection: None   Other Topics Concern  . Not on file   Social History Narrative   Any pets in the household: no Not currently working  Allergy: No Known Allergies  Health Maintenance:  Mammogram Up to Date: not applicable  Current Outpatient Medications: Needs to pick up PNV script  Physical Exam:   BP 100/57 mmHg  Pulse 79  Wt 133 lb 4.8 oz (60.464 kg)  LMP 12/23/2015 Body mass index is 25.2 kg/(m^2). Fundal height: 25 FHTs: 140s  General appearance: Well nourished, well developed female in no acute distress.  Neck:  Supple, normal appearance, and no thyromegaly  Cardiovascular: S1, S2 normal, no murmur, rub or gallop, regular rate and rhythm Respiratory:  Clear to auscultation bilateral. Normal respiratory effort Abdomen: positive bowel sounds and no masses, hernias; diffusely non tender to palpation, non distended Neuro/Psych:  Normal mood and affect.  Skin:  Warm and dry.  Lymphatic:  No inguinal lymphadenopathy.   Pelvic exam: deferred  Imaging:  5/27: LL anterior placenta, 1.2cm Prior scans with [facility]  Assessment: pt doing well  Plan: 1. Short interval between pregnancies complicating pregnancy, antepartum, second trimester Routine care. Too late for genetic screening. Anatomy  scan ordered. 28wk labs nv.  - Hemoglobinopathy Evaluation - Prenatal Profile - Pain Mgmt, Profile 6 Conf w/o mM, U - GC/Chlamydia probe amp ([facility])not at [facility] - Culture, OB Urine - [place] MFM OB COMP + 14 WK; Future - Cystic fibrosis diagnostic study  2. Low lying placenta Pelvic rest advised - [place] MFM OB COMP + 14 WK; Future  Problem list reviewed and updated.  Follow up in 3 weeks.  >50% of 20 min visit spent on counseling and coordination of care.     Cornelia Copa MD Attending Center for [facility])

## 2016-07-07 LAB — PAIN MGMT, PROFILE 6 CONF W/O MM, U
6 Acetylmorphine: NEGATIVE ng/mL (ref <10)
Alcohol Metabolites: NEGATIVE ng/mL (ref <500)
Amphetamines: NEGATIVE ng/mL (ref <500)
BENZODIAZEPINES: NEGATIVE ng/mL (ref <100)
Barbiturates: NEGATIVE ng/mL (ref <300)
Cocaine Metabolite: NEGATIVE ng/mL (ref <150)
Creatinine: 96.7 mg/dL (ref >=20.0)
MARIJUANA METABOLITE: NEGATIVE ng/mL (ref <20)
Methadone Metabolite: NEGATIVE ng/mL (ref <100)
OPIATES: NEGATIVE ng/mL (ref <100)
OXIDANT: NEGATIVE ug/mL (ref <200)
OXYCODONE: NEGATIVE ng/mL (ref <100)
PLEASE NOTE: 0
Phencyclidine: NEGATIVE ng/mL (ref <25)
pH: 7.98 (ref 4.5–9.0)

## 2016-07-07 LAB — PRENATAL PROFILE (SOLSTAS)
Antibody Screen: NEGATIVE
BASOS PCT: 0 %
Basophils Absolute: 0 cells/uL (ref 0–200)
Eosinophils Absolute: 102 cells/uL (ref 15–500)
Eosinophils Relative: 1 %
HEMATOCRIT: 33.5 % — AB (ref 35.0–45.0)
HEP B S AG: NEGATIVE
HIV: NONREACTIVE
Hemoglobin: 11.2 g/dL — ABNORMAL LOW (ref 11.7–15.5)
LYMPHS ABS: 1326 {cells}/uL (ref 850–3900)
Lymphocytes Relative: 13 %
MCH: 31 pg (ref 27.0–33.0)
MCHC: 33.4 g/dL (ref 32.0–36.0)
MCV: 92.8 fL (ref 80.0–100.0)
MONO ABS: 612 {cells}/uL (ref 200–950)
MPV: 11 fL (ref 7.5–12.5)
Monocytes Relative: 6 %
Neutro Abs: 8160 cells/uL — ABNORMAL HIGH (ref 1500–7800)
Neutrophils Relative %: 80 %
Platelets: 209 10*3/uL (ref 140–400)
RBC: 3.61 MIL/uL — AB (ref 3.80–5.10)
RDW: 13.3 % (ref 11.0–15.0)
RH TYPE: POSITIVE
Rubella: 2.61 Index — ABNORMAL HIGH (ref <0.90)
WBC: 10.2 10*3/uL (ref 3.8–10.8)

## 2016-07-07 LAB — GC/CHLAMYDIA PROBE AMP (~~LOC~~) NOT AT ARMC
CHLAMYDIA, DNA PROBE: NEGATIVE
Neisseria Gonorrhea: NEGATIVE

## 2016-07-08 LAB — CULTURE, OB URINE: Colony Count: >=100000

## 2016-07-10 LAB — HEMOGLOBINOPATHY EVALUATION
HEMATOCRIT: 33.5 % — AB (ref 35.0–45.0)
HEMOGLOBIN: 11.2 g/dL — AB (ref 11.7–15.5)
Hgb A2 Quant: 2.5 % (ref 1.8–3.5)
Hgb A: 96.5 % (ref >96.0)
Hgb F Quant: <1 % (ref <2.0)
MCH: 31 pg (ref 27.0–33.0)
MCV: 92.8 fL (ref 80.0–100.0)
RBC: 3.61 MIL/uL — AB (ref 3.80–5.10)
RDW: 13.3 % (ref 11.0–15.0)

## 2016-07-11 LAB — CYSTIC FIBROSIS DIAGNOSTIC STUDY

## 2016-07-12 ENCOUNTER — Encounter (HOSPITAL_COMMUNITY): Payer: Self-pay | Admitting: Obstetrics and Gynecology

## 2016-07-12 ENCOUNTER — Encounter: Payer: Self-pay | Admitting: Obstetrics and Gynecology

## 2016-07-12 DIAGNOSIS — R8271 Bacteriuria: Secondary | ICD-10-CM | POA: Insufficient documentation

## 2016-07-17 ENCOUNTER — Ambulatory Visit (HOSPITAL_COMMUNITY): Payer: MEDICAID

## 2016-07-19 ENCOUNTER — Other Ambulatory Visit: Payer: Self-pay | Admitting: Obstetrics and Gynecology

## 2016-07-19 ENCOUNTER — Ambulatory Visit (HOSPITAL_COMMUNITY)
Admission: RE | Admit: 2016-07-19 | Discharge: 2016-07-19 | Disposition: A | Discharge status: Home / Self Care | Payer: Self-pay | Source: Ambulatory Visit | Attending: Obstetrics and Gynecology | Admitting: Obstetrics and Gynecology

## 2016-07-19 DIAGNOSIS — Z3A26 26 weeks gestation of pregnancy: Secondary | ICD-10-CM | POA: Insufficient documentation

## 2016-07-19 DIAGNOSIS — Z3689 Encounter for other specified antenatal screening: Secondary | ICD-10-CM

## 2016-07-19 DIAGNOSIS — O09892 Supervision of other high risk pregnancies, second trimester: Secondary | ICD-10-CM

## 2016-07-19 DIAGNOSIS — O0932 Supervision of pregnancy with insufficient antenatal care, second trimester: Secondary | ICD-10-CM | POA: Insufficient documentation

## 2016-07-20 ENCOUNTER — Telehealth: Payer: Self-pay | Admitting: General Practice

## 2016-07-20 DIAGNOSIS — O2342 Unspecified infection of urinary tract in pregnancy, second trimester: Secondary | ICD-10-CM

## 2016-07-20 MED ORDER — SULFAMETHOXAZOLE-TRIMETHOPRIM 800-160 MG PO TABS: 1.0000 | ORAL_TABLET | Freq: Two times a day (BID) | ORAL | 0 refills | Status: DC

## 2016-07-20 NOTE — Telephone Encounter (Signed)
Per Dr Vergie Living, patient has UTI and needs bactrim sent to pharmacy. Called patient, no answer- left message stating we are calling to let you know your most recent urine culture shows you have a UTI and we have sent an antibiotic to your [facility] pharmacy for you to take twice a day for one week. If you have any questions or concerns you can call us back at the office

## 2016-08-01 ENCOUNTER — Ambulatory Visit (INDEPENDENT_AMBULATORY_CARE_PROVIDER_SITE_OTHER): Payer: Self-pay | Admitting: Medical

## 2016-08-01 VITALS — BP 99/65 | HR 84 | Wt 139.0 lb

## 2016-08-01 DIAGNOSIS — B009 Herpesviral infection, unspecified: Secondary | ICD-10-CM

## 2016-08-01 DIAGNOSIS — Z23 Encounter for immunization: Secondary | ICD-10-CM

## 2016-08-01 DIAGNOSIS — O98313 Other infections with a predominantly sexual mode of transmission complicating pregnancy, third trimester: Secondary | ICD-10-CM

## 2016-08-01 DIAGNOSIS — Z3402 Encounter for supervision of normal first pregnancy, second trimester: Secondary | ICD-10-CM

## 2016-08-01 DIAGNOSIS — Z3493 Encounter for supervision of normal pregnancy, unspecified, third trimester: Secondary | ICD-10-CM

## 2016-08-01 DIAGNOSIS — A6009 Herpesviral infection of other urogenital tract: Secondary | ICD-10-CM

## 2016-08-01 LAB — CBC
HCT: 33.6 % — ABNORMAL LOW (ref 35.0–45.0)
Hemoglobin: 11 g/dL — ABNORMAL LOW (ref 11.7–15.5)
MCH: 30.2 pg (ref 27.0–33.0)
MCHC: 32.7 g/dL (ref 32.0–36.0)
MCV: 92.3 fL (ref 80.0–100.0)
MPV: 11.5 fL (ref 7.5–12.5)
PLATELETS: 217 10*3/uL (ref 140–400)
RBC: 3.64 MIL/uL — AB (ref 3.80–5.10)
RDW: 13.6 % (ref 11.0–15.0)
WBC: 11.1 10*3/uL — AB (ref 3.8–10.8)

## 2016-08-01 LAB — POCT URINALYSIS DIP (DEVICE)
Bilirubin Urine: NEGATIVE
GLUCOSE, UA: NEGATIVE mg/dL
HGB URINE DIPSTICK: NEGATIVE
Ketones, ur: NEGATIVE mg/dL
NITRITE: NEGATIVE
Protein, ur: 30 mg/dL — AB
Specific Gravity, Urine: 1.025 (ref 1.005–1.030)
UROBILINOGEN UA: 0.2 mg/dL (ref 0.0–1.0)
pH: 5.5 (ref 5.0–8.0)

## 2016-08-01 MED ORDER — TETANUS-DIPHTH-ACELL PERTUSSIS 5-2.5-18.5 LF-MCG/0.5 IM SUSP
0.5000 mL | Freq: Once | INTRAMUSCULAR | Status: AC
  Administered 2016-08-01: 0.5 mL via INTRAMUSCULAR

## 2016-08-01 MED ORDER — ACYCLOVIR 400 MG PO TABS: 400.0000 mg | ORAL_TABLET | Freq: Three times a day (TID) | ORAL | 2 refills | Status: DC

## 2016-08-01 NOTE — Patient Instructions (Addendum)
Fetal Movement Counts Patient Name: __________________________________________________ Patient Due Date: ____________________ Performing a fetal movement count is highly recommended in high-risk pregnancies, but it is good for every pregnant woman to do. Your health care provider may ask you to start counting fetal movements at 28 weeks of the pregnancy. Fetal movements often increase:  After eating a full meal.  After physical activity.  After eating or drinking something sweet or cold.  At rest. Pay attention to when you feel the baby is most active. This will help you notice a pattern of your baby's sleep and wake cycles and what factors contribute to an increase in fetal movement. It is important to perform a fetal movement count at the same time each day when your baby is normally most active.  HOW TO COUNT FETAL MOVEMENTS 1. Find a quiet and comfortable area to sit or lie down on your left side. Lying on your left side provides the best blood and oxygen circulation to your baby. 2. Write down the day and time on a sheet of paper or in a journal. 3. Start counting kicks, flutters, swishes, rolls, or jabs in a 2-hour period. You should feel at least 10 movements within 2 hours. 4. If you do not feel 10 movements in 2 hours, wait 2-3 hours and count again. Look for a change in the pattern or not enough counts in 2 hours. SEEK MEDICAL CARE IF:  You feel less than 10 counts in 2 hours, tried twice.  There is no movement in over an hour.  The pattern is changing or taking longer each day to reach 10 counts in 2 hours.  You feel the baby is not moving as he or she usually does. Date: ____________ Movements: ____________ Start time: ____________ Finish time: ____________  Date: ____________ Movements: ____________ Start time: ____________ Finish time: ____________ Date: ____________ Movements: ____________ Start time: ____________ Finish time: ____________ Date: ____________ Movements:  ____________ Start time: ____________ Finish time: ____________ Date: ____________ Movements: ____________ Start time: ____________ Finish time: ____________ Date: ____________ Movements: ____________ Start time: ____________ Finish time: ____________ Date: ____________ Movements: ____________ Start time: ____________ Finish time: ____________ Date: ____________ Movements: ____________ Start time: ____________ Finish time: ____________  Date: ____________ Movements: ____________ Start time: ____________ Finish time: ____________ Date: ____________ Movements: ____________ Start time: ____________ Finish time: ____________ Date: ____________ Movements: ____________ Start time: ____________ Finish time: ____________ Date: ____________ Movements: ____________ Start time: ____________ Finish time: ____________ Date: ____________ Movements: ____________ Start time: ____________ Finish time: ____________ Date: ____________ Movements: ____________ Start time: ____________ Finish time: ____________ Date: ____________ Movements: ____________ Start time: ____________ Finish time: ____________  Date: ____________ Movements: ____________ Start time: ____________ Finish time: ____________ Date: ____________ Movements: ____________ Start time: ____________ Finish time: ____________ Date: ____________ Movements: ____________ Start time: ____________ Finish time: ____________ Date: ____________ Movements: ____________ Start time: ____________ Finish time: ____________ Date: ____________ Movements: ____________ Start time: ____________ Finish time: ____________ Date: ____________ Movements: ____________ Start time: ____________ Finish time: ____________ Date: ____________ Movements: ____________ Start time: ____________ Finish time: ____________  Date: ____________ Movements: ____________ Start time: ____________ Finish time: ____________ Date: ____________ Movements: ____________ Start time: ____________ Finish  time: ____________ Date: ____________ Movements: ____________ Start time: ____________ Finish time: ____________ Date: ____________ Movements: ____________ Start time: ____________ Finish time: ____________ Date: ____________ Movements: ____________ Start time: ____________ Finish time: ____________ Date: ____________ Movements: ____________ Start time: ____________ Finish time: ____________ Date: ____________ Movements: ____________ Start time: ____________ Finish time: ____________  Date: ____________ Movements: ____________ Start time: ____________ Finish   time: ____________ Date: ____________ Movements: ____________ Start time: ____________ Finish time: ____________ Date: ____________ Movements: ____________ Start time: ____________ Finish time: ____________ Date: ____________ Movements: ____________ Start time: ____________ Finish time: ____________ Date: ____________ Movements: ____________ Start time: ____________ Finish time: ____________ Date: ____________ Movements: ____________ Start time: ____________ Finish time: ____________ Date: ____________ Movements: ____________ Start time: ____________ Finish time: ____________  Date: ____________ Movements: ____________ Start time: ____________ Finish time: ____________ Date: ____________ Movements: ____________ Start time: ____________ Finish time: ____________ Date: ____________ Movements: ____________ Start time: ____________ Finish time: ____________ Date: ____________ Movements: ____________ Start time: ____________ Finish time: ____________ Date: ____________ Movements: ____________ Start time: ____________ Finish time: ____________ Date: ____________ Movements: ____________ Start time: ____________ Finish time: ____________ Date: ____________ Movements: ____________ Start time: ____________ Finish time: ____________  Date: ____________ Movements: ____________ Start time: ____________ Finish time: ____________ Date: ____________  Movements: ____________ Start time: ____________ Finish time: ____________ Date: ____________ Movements: ____________ Start time: ____________ Finish time: ____________ Date: ____________ Movements: ____________ Start time: ____________ Finish time: ____________ Date: ____________ Movements: ____________ Start time: ____________ Finish time: ____________ Date: ____________ Movements: ____________ Start time: ____________ Finish time: ____________ Date: ____________ Movements: ____________ Start time: ____________ Finish time: ____________  Date: ____________ Movements: ____________ Start time: ____________ Finish time: ____________ Date: ____________ Movements: ____________ Start time: ____________ Finish time: ____________ Date: ____________ Movements: ____________ Start time: ____________ Finish time: ____________ Date: ____________ Movements: ____________ Start time: ____________ Finish time: ____________ Date: ____________ Movements: ____________ Start time: ____________ Finish time: ____________ Date: ____________ Movements: ____________ Start time: ____________ Finish time: ____________   This information is not intended to replace advice given to you by your health care provider. Make sure you discuss any questions you have with your health care provider.   Document Released: 01/10/2007 Document Revised: 01/01/2015 Document Reviewed: 10/07/2012 Elsevier Interactive Patient Education 2016 [employer] Third Trimester of Pregnancy The third trimester is from week 29 through week 42, months 7 through 9. This trimester is when your unborn baby (fetus) is growing very fast. At the end of the ninth month, the unborn baby is about 20 inches in length. It weighs about 6-10 pounds.  HOME CARE   Avoid all smoking, herbs, and alcohol. Avoid drugs not approved by your doctor.  Do not use any tobacco products, including cigarettes, chewing tobacco, and electronic cigarettes. If you need help  quitting, ask your doctor. You may get counseling or other support to help you quit.  Only take medicine as told by your doctor. Some medicines are safe and some are not during pregnancy.  Exercise only as told by your doctor. Stop exercising if you start having cramps.  Eat regular, healthy meals.  Wear a good support bra if your breasts are tender.  Do not use hot tubs, steam rooms, or saunas.  Wear your seat belt when driving.  Avoid raw meat, uncooked cheese, and liter boxes and soil used by cats.  Take your prenatal vitamins.  Take 1500-2000 milligrams of calcium daily starting at the 20th week of pregnancy until you deliver your baby.  Try taking medicine that helps you poop (stool softener) as needed, and if your doctor approves. Eat more fiber by eating fresh fruit, vegetables, and whole grains. Drink enough fluids to keep your pee (urine) clear or pale yellow.  Take warm water baths (sitz baths) to soothe pain or discomfort caused by hemorrhoids. Use hemorrhoid cream if your doctor approves.  If you have puffy, bulging veins (varicose veins), wear support hose. Raise (elevate) your   feet for 15 minutes, 3-4 times a day. Limit salt in your diet.  Avoid heavy lifting, wear low heels, and sit up straight.  Rest with your legs raised if you have leg cramps or low back pain.  Visit your dentist if you have not gone during your pregnancy. Use a soft toothbrush to brush your teeth. Be gentle when you floss.  You can have sex (intercourse) unless your doctor tells you not to.  Do not travel far distances unless you must. Only do so with your doctor's approval.  Take prenatal classes.  Practice driving to the hospital.  Pack your hospital bag.  Prepare the baby's room.  Go to your doctor visits. GET HELP IF:  You are not sure if you are in labor or if your water has broken.  You are dizzy.  You have mild cramps or pressure in your lower belly (abdominal).  You have  a nagging pain in your belly area.  You continue to feel sick to your stomach (nauseous), throw up (vomit), or have watery poop (diarrhea).  You have bad smelling fluid coming from your vagina.  You have pain with peeing (urination). GET HELP RIGHT AWAY IF:   You have a fever.  You are leaking fluid from your vagina.  You are spotting or bleeding from your vagina.  You have severe belly cramping or pain.  You lose or gain weight rapidly.  You have trouble catching your breath and have chest pain.  You notice sudden or extreme puffiness (swelling) of your face, hands, ankles, feet, or legs.  You have not felt the baby move in over an hour.  You have severe headaches that do not go away with medicine.  You have vision changes.   This information is not intended to replace advice given to you by your health care provider. Make sure you discuss any questions you have with your health care provider.   Document Released: 03/07/2010 Document Revised: 01/01/2015 Document Reviewed: 02/11/2013 Elsevier Interactive Patient Education 2016 [employer] Herpes During and After Pregnancy Genital herpes is a sexually transmitted infection (STI). It is caused by a virus and can be very serious during pregnancy. The greatest concern is passing the virus to the fetus and newborn. The virus is more likely to be passed to the newborn during delivery if the mother becomes infected for the first time late in her pregnancy (primary infection). It is less common for the virus to pass to the newborn if the mother had herpes before becoming pregnant. This is because antibodies against the virus develop over a period of time. These antibodies help protect the baby. Lastly, the infection can pass to the fetus through the placenta. This can happen if the mother gets herpes for the first time in the first 3 months of her pregnancy (first trimester). This can possibly cause a miscarriage or birth defects in the  baby. TREATMENT DURING PREGNANCY Medicines may be prescribed that are safe for the mother and the fetus. The medicine can lessen symptoms or prevent a recurrence of the infection. If the infection happened before becoming pregnant, medicine may be prescribed in the last 4 weeks of the pregnancy. This can prevent a recurrent infection at the time of delivery.  RECOMMENDED DELIVERY METHOD If an active, recurrent infection is present at the time delivery, the baby should be delivered by cesarean delivery. This is because the virus can pass to the baby through an infected birth canal. This can cause severe problems  for the baby. If the infection happens for the first time late in the pregnancy, the caregiver may also recommend a cesarean delivery. With a new infection, the body has not had the time to build up enough antibodies against the virus to protect the baby from getting the infection. Even if the birth canal does not have visible sores (lesions) from the herpes virus, there is still that chance that the virus can spread to the baby. A cesarean delivery should be done if there is any signs or feeling of an infection being present in the genital area. Cesarean delivery is not recommended for women with a history of herpes infection but no evidence of active genital lesions at the time of delivery. Lesions that have crusted fully are considered healed and not active. BREASTFEEDING  Women infected with genital herpes can breastfeed their baby. The virus will not be present in the breast milk. If lesions are present on the breast, the baby should not breastfeed from the affected breast(s). HOW TO PREVENT PASSING HERPES TO YOUR BABY AFTER DELIVERY  Wash your hands with soap and water often and before touching your baby.  If you have an outbreak, keep the area clean and covered.  Try to avoid physical and stressful situations that may bring on an outbreak. SEEK IMMEDIATE MEDICAL CARE IF:   You have an  outbreak during pregnancy and cannot urinate.  You have an outbreak anytime during your pregnancy and especially in the last 3 months of the pregnancy.  You think you are having an allergic reaction or side effects from the medicine you are taking.   This information is not intended to replace advice given to you by your health care provider. Make sure you discuss any questions you have with your health care provider.   Document Released: 03/19/2001 Document Revised: 01/01/2015 Document Reviewed: 11/21/2011 Elsevier Interactive Patient Education [employer]

## 2016-08-01 NOTE — Progress Notes (Signed)
Subjective:  [name] Richards is a 20 y.o. G2P1001 at 3625w2d being seen today for ongoing prenatal care.  She is currently monitored for the following issues for this low-risk pregnancy and has Supervision of normal first pregnancy in second trimester; Genital herpes affecting pregnancy; Late prenatal care; and Asymptomatic bacteriuria on her problem list.  Patient reports difficulty sleeping and possible HSV outbreak.  Contractions: Not present. Vag. Bleeding: None.  Movement: Present. Denies leaking of fluid.   The following portions of the patient's history were reviewed and updated as appropriate: allergies, current medications, past family history, past medical history, past social history, past surgical history and problem list. Problem list updated.  Objective:   Vitals:   08/01/16 0753  BP: 99/65  Pulse: 84  Weight: 139 lb (63 kg)    Fetal Status: Fetal Heart Rate (bpm): 149 Fundal Height: 28 cm Movement: Present     General:  Alert, oriented and cooperative. Patient is in no acute distress.  Skin: Skin is warm and dry. No rash noted.   Cardiovascular: Normal heart rate noted  Respiratory: Normal respiratory effort, no problems with respiration noted  Abdomen: Soft, gravid, appropriate for gestational age. Pain/Pressure: Present     Pelvic:  Cervical exam deferred       Small area of ulceration on the right labia with minimal surrounding erythema   Extremities: Normal range of motion.  Edema: Trace  Mental Status: Normal mood and affect. Normal behavior. Normal judgment and thought content.   Urinalysis:     patient did not leave urine sample today   Assessment and Plan:  Pregnancy: G2P1001 at 3825w2d  1. Supervision of low-risk pregnancy, third trimester - Glucose Tolerance, 1 HR (50g) w/o Fasting - RPR - CBC - HIV antibody (with reflex)  2. HSV (herpes simplex virus) infection - acyclovir (ZOVIRAX) 400 MG tablet; Take 1 tablet (400 mg total) by mouth 3 (three) times daily.   Dispense: 15 tablet; Refill: 2   Preterm labor symptoms and general obstetric precautions including but not limited to vaginal bleeding, contractions, leaking of fluid and fetal movement were reviewed in detail with the patient. Please refer to After Visit Summary for other counseling recommendations.  Return in about 2 weeks (around 08/15/2016) for LROB.   Marny LowensteinJulie N Juanangel Soderholm, PA-C

## 2016-08-01 NOTE — Progress Notes (Signed)
Educated pt on Rooming In 28 wk labs today  28 wk packet given tdap today

## 2016-08-02 LAB — GLUCOSE TOLERANCE, 1 HOUR (50G) W/O FASTING: GLUCOSE, 1 HR, GESTATIONAL: 82 mg/dL (ref <140)

## 2016-08-02 LAB — HIV ANTIBODY (ROUTINE TESTING W REFLEX): HIV: NONREACTIVE

## 2016-08-04 LAB — RPR

## 2016-08-21 ENCOUNTER — Ambulatory Visit (INDEPENDENT_AMBULATORY_CARE_PROVIDER_SITE_OTHER): Payer: Self-pay | Admitting: Family Medicine

## 2016-08-21 VITALS — BP 106/56 | HR 70 | Wt 140.1 lb

## 2016-08-21 DIAGNOSIS — A6009 Herpesviral infection of other urogenital tract: Secondary | ICD-10-CM

## 2016-08-21 DIAGNOSIS — O0933 Supervision of pregnancy with insufficient antenatal care, third trimester: Secondary | ICD-10-CM

## 2016-08-21 DIAGNOSIS — O98319 Other infections with a predominantly sexual mode of transmission complicating pregnancy, unspecified trimester: Principal | ICD-10-CM

## 2016-08-21 DIAGNOSIS — O98313 Other infections with a predominantly sexual mode of transmission complicating pregnancy, third trimester: Secondary | ICD-10-CM

## 2016-08-21 LAB — POCT URINALYSIS DIP (DEVICE)
BILIRUBIN URINE: NEGATIVE
GLUCOSE, UA: NEGATIVE mg/dL
Hgb urine dipstick: NEGATIVE
Ketones, ur: 15 mg/dL — AB
NITRITE: NEGATIVE
Protein, ur: NEGATIVE mg/dL
Specific Gravity, Urine: 1.025 (ref 1.005–1.030)
UROBILINOGEN UA: 0.2 mg/dL (ref 0.0–1.0)
pH: 6 (ref 5.0–8.0)

## 2016-08-21 NOTE — Progress Notes (Signed)
Subjective:    [name] Richards is a 20 y.o. female being seen today for her obstetrical visit. She is at 132w1d gestation. Patient reports recent HSV outbreak, symptoms gone and has one healing lesion. Took medication for one full week. Complaining of some tailbone pain, has h/o injury to tailbone. Fetal movement: normal.  Review of Systems:   Review of Systems  A comprehensive ROS was negative except per HPI.    Objective:    BP (!) 106/56   Pulse 70   Wt 140 lb 1.6 oz (63.5 kg)   LMP 12/23/2015 Comment: No period since baby born in September  BMI 26.47 kg/m   Physical Exam  Exam  FHT:  145 BPM  Uterine Size: 31 cm  Presentation: unsure     Assessment:    Pregnancy:  G2P1001    Plan:    Patient Active Problem List   Diagnosis Date Noted  . Asymptomatic bacteriuria 07/12/2016  . Late prenatal care 07/06/2016  . Supervision of normal first pregnancy in third trimester 03/03/2015  . Genital herpes affecting pregnancy 03/03/2015    Discussed signs/symptoms of genital herpes infection. Discussed when she is supposed to start taking suppressive medication for the duration of her pregnancy until delivery.  Follow up in 2 Weeks.

## 2016-08-21 NOTE — Patient Instructions (Signed)
Herpes During and After Pregnancy Genital herpes is a sexually transmitted infection (STI). It is caused by a virus and can be very serious during pregnancy. The greatest concern is passing the virus to the fetus and newborn. The virus is more likely to be passed to the newborn during delivery if the mother becomes infected for the first time late in her pregnancy (primary infection). It is less common for the virus to pass to the newborn if the mother had herpes before becoming pregnant. This is because antibodies against the virus develop over a period of time. These antibodies help protect the baby. Lastly, the infection can pass to the fetus through the placenta. This can happen if the mother gets herpes for the first time in the first 3 months of her pregnancy (first trimester). This can possibly cause a miscarriage or birth defects in the baby. TREATMENT DURING PREGNANCY Medicines may be prescribed that are safe for the mother and the fetus. The medicine can lessen symptoms or prevent a recurrence of the infection. If the infection happened before becoming pregnant, medicine may be prescribed in the last 4 weeks of the pregnancy. This can prevent a recurrent infection at the time of delivery.  RECOMMENDED DELIVERY METHOD If an active, recurrent infection is present at the time delivery, the baby should be delivered by cesarean delivery. This is because the virus can pass to the baby through an infected birth canal. This can cause severe problems for the baby. If the infection happens for the first time late in the pregnancy, the caregiver may also recommend a cesarean delivery. With a new infection, the body has not had the time to build up enough antibodies against the virus to protect the baby from getting the infection. Even if the birth canal does not have visible sores (lesions) from the herpes virus, there is still that chance that the virus can spread to the baby. A cesarean delivery should be  done if there is any signs or feeling of an infection being present in the genital area. Cesarean delivery is not recommended for women with a history of herpes infection but no evidence of active genital lesions at the time of delivery. Lesions that have crusted fully are considered healed and not active. BREASTFEEDING  Women infected with genital herpes can breastfeed their baby. The virus will not be present in the breast milk. If lesions are present on the breast, the baby should not breastfeed from the affected breast(s). HOW TO PREVENT PASSING HERPES TO YOUR BABY AFTER DELIVERY  Wash your hands with soap and water often and before touching your baby.  If you have an outbreak, keep the area clean and covered.  Try to avoid physical and stressful situations that may bring on an outbreak. SEEK IMMEDIATE MEDICAL CARE IF:   You have an outbreak during pregnancy and cannot urinate.  You have an outbreak anytime during your pregnancy and especially in the last 3 months of the pregnancy.  You think you are having an allergic reaction or side effects from the medicine you are taking.   This information is not intended to replace advice given to you by your health care provider. Make sure you discuss any questions you have with your health care provider.   Document Released: 03/19/2001 Document Revised: 01/01/2015 Document Reviewed: 11/21/2011 Elsevier Interactive Patient Education [employer]  Third Trimester of Pregnancy The third trimester is from week 29 through week 42, months 7 through 9. The third trimester is  a time when the fetus is growing rapidly. At the end of the ninth month, the fetus is about 20 inches in length and weighs 6-10 pounds.  BODY CHANGES Your body goes through many changes during pregnancy. The changes vary from woman to woman.   Your weight will continue to increase. You can expect to gain 25-35 pounds (11-16 kg) by the end of the pregnancy.  You may  begin to get stretch marks on your hips, abdomen, and breasts.  You may urinate more often because the fetus is moving lower into your pelvis and pressing on your bladder.  You may develop or continue to have heartburn as a result of your pregnancy.  You may develop constipation because certain hormones are causing the muscles that push waste through your intestines to slow down.  You may develop hemorrhoids or swollen, bulging veins (varicose veins).  You may have pelvic pain because of the weight gain and pregnancy hormones relaxing your joints between the bones in your pelvis. Backaches may result from overexertion of the muscles supporting your posture.  You may have changes in your hair. These can include thickening of your hair, rapid growth, and changes in texture. Some women also have hair loss during or after pregnancy, or hair that feels dry or thin. Your hair will most likely return to normal after your baby is born.  Your breasts will continue to grow and be tender. A yellow discharge may leak from your breasts called colostrum.  Your belly button may stick out.  You may feel short of breath because of your expanding uterus.  You may notice the fetus "dropping," or moving lower in your abdomen.  You may have a bloody mucus discharge. This usually occurs a few days to a week before labor begins.  Your cervix becomes thin and soft (effaced) near your due date. WHAT TO EXPECT AT YOUR PRENATAL EXAMS  You will have prenatal exams every 2 weeks until week 36. Then, you will have weekly prenatal exams. During a routine prenatal visit:  You will be weighed to make sure you and the fetus are growing normally.  Your blood pressure is taken.  Your abdomen will be measured to track your baby's growth.  The fetal heartbeat will be listened to.  Any test results from the previous visit will be discussed.  You may have a cervical check near your due date to see if you have  effaced. At around 36 weeks, your caregiver will check your cervix. At the same time, your caregiver will also perform a test on the secretions of the vaginal tissue. This test is to determine if a type of bacteria, Group B streptococcus, is present. Your caregiver will explain this further. Your caregiver may ask you:  What your birth plan is.  How you are feeling.  If you are feeling the baby move.  If you have had any abnormal symptoms, such as leaking fluid, bleeding, severe headaches, or abdominal cramping.  If you are using any tobacco products, including cigarettes, chewing tobacco, and electronic cigarettes.  If you have any questions. Other tests or screenings that may be performed during your third trimester include:  Blood tests that check for low iron levels (anemia).  Fetal testing to check the health, activity level, and growth of the fetus. Testing is done if you have certain medical conditions or if there are problems during the pregnancy.  HIV (human immunodeficiency virus) testing. If you are at high risk, you may be  screened for HIV during your third trimester of pregnancy. FALSE LABOR You may feel small, irregular contractions that eventually go away. These are called Braxton Hicks contractions, or false labor. Contractions may last for hours, days, or even weeks before true labor sets in. If contractions come at regular intervals, intensify, or become painful, it is best to be seen by your caregiver.  SIGNS OF LABOR   Menstrual-like cramps.  Contractions that are 5 minutes apart or less.  Contractions that start on the top of the uterus and spread down to the lower abdomen and back.  A sense of increased pelvic pressure or back pain.  A watery or bloody mucus discharge that comes from the vagina. If you have any of these signs before the 37th week of pregnancy, call your caregiver right away. You need to go to the hospital to get checked immediately. HOME CARE  INSTRUCTIONS   Avoid all smoking, herbs, alcohol, and unprescribed drugs. These chemicals affect the formation and growth of the baby.  Do not use any tobacco products, including cigarettes, chewing tobacco, and electronic cigarettes. If you need help quitting, ask your health care provider. You may receive counseling support and other resources to help you quit.  Follow your caregiver's instructions regarding medicine use. There are medicines that are either safe or unsafe to take during pregnancy.  Exercise only as directed by your caregiver. Experiencing uterine cramps is a good sign to stop exercising.  Continue to eat regular, healthy meals.  Wear a good support bra for breast tenderness.  Do not use hot tubs, steam rooms, or saunas.  Wear your seat belt at all times when driving.  Avoid raw meat, uncooked cheese, cat litter boxes, and soil used by cats. These carry germs that can cause birth defects in the baby.  Take your prenatal vitamins.  Take 1500-2000 mg of calcium daily starting at the 20th week of pregnancy until you deliver your baby.  Try taking a stool softener (if your caregiver approves) if you develop constipation. Eat more high-fiber foods, such as fresh vegetables or fruit and whole grains. Drink plenty of fluids to keep your urine clear or pale yellow.  Take warm sitz baths to soothe any pain or discomfort caused by hemorrhoids. Use hemorrhoid cream if your caregiver approves.  If you develop varicose veins, wear support hose. Elevate your feet for 15 minutes, 3-4 times a day. Limit salt in your diet.  Avoid heavy lifting, wear low heal shoes, and practice good posture.  Rest a lot with your legs elevated if you have leg cramps or low back pain.  Visit your dentist if you have not gone during your pregnancy. Use a soft toothbrush to brush your teeth and be gentle when you floss.  A sexual relationship may be continued unless your caregiver directs you  otherwise.  Do not travel far distances unless it is absolutely necessary and only with the approval of your caregiver.  Take prenatal classes to understand, practice, and ask questions about the labor and delivery.  Make a trial run to the hospital.  Pack your hospital bag.  Prepare the baby's nursery.  Continue to go to all your prenatal visits as directed by your caregiver. SEEK MEDICAL CARE IF:  You are unsure if you are in labor or if your water has broken.  You have dizziness.  You have mild pelvic cramps, pelvic pressure, or nagging pain in your abdominal area.  You have persistent nausea, vomiting, or diarrhea.  You have a bad smelling vaginal discharge.  You have pain with urination. SEEK IMMEDIATE MEDICAL CARE IF:   You have a fever.  You are leaking fluid from your vagina.  You have spotting or bleeding from your vagina.  You have severe abdominal cramping or pain.  You have rapid weight loss or gain.  You have shortness of breath with chest pain.  You notice sudden or extreme swelling of your face, hands, ankles, feet, or legs.  You have not felt your baby move in over an hour.  You have severe headaches that do not go away with medicine.  You have vision changes.   This information is not intended to replace advice given to you by your health care provider. Make sure you discuss any questions you have with your health care provider.   Document Released: 12/05/2001 Document Revised: 01/01/2015 Document Reviewed: 02/11/2013 Elsevier Interactive Patient Education [employer] Elsevier Inc.

## 2016-08-22 ENCOUNTER — Encounter: Payer: Self-pay | Admitting: Student

## 2016-09-11 ENCOUNTER — Ambulatory Visit (INDEPENDENT_AMBULATORY_CARE_PROVIDER_SITE_OTHER): Payer: Self-pay | Admitting: Student

## 2016-09-11 ENCOUNTER — Encounter: Payer: Self-pay | Admitting: Student

## 2016-09-11 ENCOUNTER — Ambulatory Visit (INDEPENDENT_AMBULATORY_CARE_PROVIDER_SITE_OTHER): Payer: Self-pay | Admitting: Clinical

## 2016-09-11 VITALS — BP 109/58 | HR 88 | Wt 145.0 lb

## 2016-09-11 DIAGNOSIS — O98519 Other viral diseases complicating pregnancy, unspecified trimester: Secondary | ICD-10-CM

## 2016-09-11 DIAGNOSIS — A6009 Herpesviral infection of other urogenital tract: Secondary | ICD-10-CM

## 2016-09-11 DIAGNOSIS — Z3403 Encounter for supervision of normal first pregnancy, third trimester: Secondary | ICD-10-CM

## 2016-09-11 DIAGNOSIS — A609 Anogenital herpesviral infection, unspecified: Secondary | ICD-10-CM

## 2016-09-11 DIAGNOSIS — F4321 Adjustment disorder with depressed mood: Secondary | ICD-10-CM

## 2016-09-11 DIAGNOSIS — Z23 Encounter for immunization: Secondary | ICD-10-CM

## 2016-09-11 DIAGNOSIS — O98319 Other infections with a predominantly sexual mode of transmission complicating pregnancy, unspecified trimester: Principal | ICD-10-CM

## 2016-09-11 LAB — POCT URINALYSIS DIP (DEVICE)
BILIRUBIN URINE: NEGATIVE
Glucose, UA: NEGATIVE mg/dL
HGB URINE DIPSTICK: NEGATIVE
KETONES UR: NEGATIVE mg/dL
NITRITE: NEGATIVE
PH: 6.5 (ref 5.0–8.0)
Protein, ur: 30 mg/dL — AB
Specific Gravity, Urine: 1.02 (ref 1.005–1.030)
Urobilinogen, UA: 0.2 mg/dL (ref 0.0–1.0)

## 2016-09-11 NOTE — Progress Notes (Signed)
   PRENATAL VISIT NOTE  Subjective:  [name] Richards is a 20 y.o. G2P1001 at 6073w1d being seen today for ongoing prenatal care.  She is currently monitored for the following issues for this low-risk pregnancy and has Supervision of normal first pregnancy in third trimester; Genital herpes affecting pregnancy; Late prenatal care; and Asymptomatic bacteriuria on her problem list.  Patient reports continued HSV lesion.  Contractions: Not present. Vag. Bleeding: None.  Movement: Present. Denies leaking of fluid.   The following portions of the patient's history were reviewed and updated as appropriate: allergies, current medications, past family history, past medical history, past social history, past surgical history and problem list. Problem list updated.  Objective:   Vitals:   09/11/16 0841  BP: (!) 109/58  Pulse: 88  Weight: 145 lb (65.8 kg)    Fetal Status: Fetal Heart Rate (bpm): 138   Movement: Present     General:  Alert, oriented and cooperative. Patient is in no acute distress.  Skin: Skin is warm and dry. No rash noted.   Cardiovascular: Normal heart rate noted  Respiratory: Normal respiratory effort, no problems with respiration noted  Abdomen: Soft, gravid, appropriate for gestational age. Pain/Pressure: Present     Pelvic:  Cervical exam deferred        Extremities: Normal range of motion.  Edema: None  Mental Status: Normal mood and affect. Normal behavior. Normal judgment and thought content.   Urinalysis: Urine Protein: 1+ Urine Glucose: Negative  Assessment and Plan:  Pregnancy: G2P1001 at 2573w1d  1. Supervision of normal first pregnancy in third trimester  - Flu Vaccine QUAD 36+ mos IM (Fluarix, Quad PF)  2. Genital herpes affecting pregnancy  -acyclovir   Preterm labor symptoms and general obstetric precautions including but not limited to vaginal bleeding, contractions, leaking of fluid and fetal movement were reviewed in detail with the patient. Please refer  to After Visit Summary for other counseling recommendations.  Return in about 2 weeks (around 09/25/2016) for Routine OB.  Judeth HornErin Muhammed Teutsch, NP

## 2016-09-11 NOTE — Patient Instructions (Signed)

## 2016-09-11 NOTE — BH Specialist Note (Signed)
  ASSESSMENT: Pt currently experiencing Adjustment disorder with depressed mood. [facility] Pt would benefit from psychoeducation and brief therapeutic intervention regarding coping with symptoms of depression.  Stage of Change: precontemplative  PLAN: 1. F/U with behavioral health clinician in two weeks 2. Psychiatric Medications: none 3. Behavioral recommendations:   -Read educational materials regarding coping with symptoms of depression -Discuss antidepressant with medical provider in two weeks, if symptoms continue and/or increase -Continue watching comedies with brothers daily -Consider going on walk at local park with FOB in upcoming week -Consider [employer] walk-in to discuss housing options  SUBJECTIVE: Pt. referred by Judeth HornErin Lawrence, NP for symptoms of depression Pt. reports the following symptoms/concerns: Pt states that she has been feeling an increase in depression during this pregnancy, with primary symptoms of tiredness and lack of interest that prevent her from wanting to leave the house; copes by watching comedies with younger brothers, and caring for 1yo. Pt and FOB want to find housing together in the coming months. Duration of problem: Increase in over two months Severity:moderate   OBJECTIVE: Orientation & Cognition: Oriented x3. Thought processes normal and appropriate to situation. Mood: low Affect: appropriate Appearance: appropriate Risk of harm to self or others: no known risk of harm to self or others Substance WNU:UVOZuse:none Assessments administered: PHQ9: 13/ GAD7: 6  Diagnosis: Adjustment disorder with depressed mood CPT Code: F43.21  -------------------------------------------- Other(s) present in the room: FOB  Time spent with patient in exam room: 30 minutes, 9:15-9:45am  Depression screen [facility] 2/9 09/11/2016 08/01/2016  Decreased Interest 3 3  Down, Depressed, Hopeless 2 1  PHQ - 2 Score 5 4  Altered sleeping 1 3   Tired, decreased energy 2 3  Change in appetite 1 0  Feeling bad or failure about yourself  0 2  Trouble concentrating 1 0  Moving slowly or fidgety/restless 3 2  Suicidal thoughts 0 0  PHQ-9 Score 13 14   GAD 7 : Generalized Anxiety Score 09/11/2016 08/01/2016  Nervous, Anxious, on Edge 0 2  Control/stop worrying 0 2  Worry too much - different things 0 3  Trouble relaxing 3 3  Restless 0 0  Easily annoyed or irritable 3 3  Afraid - awful might happen 0 2  Total GAD 7 Score 6 15

## 2016-09-11 NOTE — Progress Notes (Signed)
Patient reports pelvic pressure Also states she is still having an outbreak despite taking the medication for 5 days Patient should see Jamie [facility]

## 2016-09-25 ENCOUNTER — Other Ambulatory Visit (HOSPITAL_COMMUNITY)
Admission: RE | Admit: 2016-09-25 | Discharge: 2016-09-25 | Disposition: A | Discharge status: Home / Self Care | Payer: Medicaid Other | Source: Ambulatory Visit | Attending: Obstetrics & Gynecology | Admitting: Obstetrics & Gynecology

## 2016-09-25 ENCOUNTER — Ambulatory Visit (INDEPENDENT_AMBULATORY_CARE_PROVIDER_SITE_OTHER): Payer: Self-pay | Admitting: Obstetrics & Gynecology

## 2016-09-25 VITALS — BP 103/66 | HR 87 | Wt 149.6 lb

## 2016-09-25 DIAGNOSIS — A609 Anogenital herpesviral infection, unspecified: Secondary | ICD-10-CM

## 2016-09-25 DIAGNOSIS — Z113 Encounter for screening for infections with a predominantly sexual mode of transmission: Secondary | ICD-10-CM | POA: Insufficient documentation

## 2016-09-25 DIAGNOSIS — A6009 Herpesviral infection of other urogenital tract: Secondary | ICD-10-CM

## 2016-09-25 DIAGNOSIS — Z3403 Encounter for supervision of normal first pregnancy, third trimester: Secondary | ICD-10-CM

## 2016-09-25 DIAGNOSIS — O98313 Other infections with a predominantly sexual mode of transmission complicating pregnancy, third trimester: Secondary | ICD-10-CM

## 2016-09-25 LAB — OB RESULTS CONSOLE GC/CHLAMYDIA: Gonorrhea: NEGATIVE

## 2016-09-25 MED ORDER — ACYCLOVIR 400 MG PO TABS: 400.0000 mg | ORAL_TABLET | Freq: Three times a day (TID) | ORAL | 4 refills | Status: DC

## 2016-09-25 NOTE — Progress Notes (Signed)
   PRENATAL VISIT NOTE  Subjective:  [name] Richards is a 20 y.o. G2P1001 at 7282w1d being seen today for ongoing prenatal care.  She is currently monitored for the following issues for this low-risk pregnancy and has Supervision of normal first pregnancy in third trimester; Genital herpes affecting pregnancy; Late prenatal care; and Asymptomatic bacteriuria on her problem list.  Patient reports no complaints.  Contractions: Not present. Vag. Bleeding: None.  Movement: Present. Denies leaking of fluid.   The following portions of the patient's history were reviewed and updated as appropriate: allergies, current medications, past family history, past medical history, past social history, past surgical history and problem list. Problem list updated.  Objective:   Vitals:   09/25/16 1454  BP: 103/66  Pulse: 87  Weight: 149 lb 9.6 oz (67.9 kg)    Fetal Status: Fetal Heart Rate (bpm): 140 Fundal Height: 36 cm Movement: Present  Presentation: Vertex  General:  Alert, oriented and cooperative. Patient is in no acute distress.  Skin: Skin is warm and dry. No rash noted.   Cardiovascular: Normal heart rate noted  Respiratory: Normal respiratory effort, no problems with respiration noted  Abdomen: Soft, gravid, appropriate for gestational age. Pain/Pressure: Present     Pelvic:  Cervical exam performed        Extremities: Normal range of motion.  Edema: None  Mental Status: Normal mood and affect. Normal behavior. Normal judgment and thought content.    Assessment and Plan:  Pregnancy: G2P1001 at 6982w1d  1. Genital herpes affecting pregnancy in third trimester Patient advised to take suppression as instructed - acyclovir (ZOVIRAX) 400 MG tablet; Take 1 tablet (400 mg total) by mouth 3 (three) times daily.  Dispense: 90 tablet; Refill: 4  2. Encounter for supervision of normal first pregnancy, third trimester Pelvic cultures done today - Culture, beta strep (group b only) - GC/Chlamydia probe  amp [facility] Preterm labor symptoms and general obstetric precautions including but not limited to vaginal bleeding, contractions, leaking of fluid and fetal movement were reviewed in detail with the patient. Please refer to After Visit Summary for other counseling recommendations.  Return in about 1 week (around 10/02/2016) for OB Visit.  Tereso NewcomerUgonna A Siena Poehler, MD

## 2016-09-25 NOTE — Patient Instructions (Signed)
Regrese a la clinica cuando tenga su cita. Si tiene problemas o preguntas, llama a la clinica o vaya a la [facility]a de [facility] al [facility].    

## 2016-09-26 LAB — GC/CHLAMYDIA PROBE AMP (~~LOC~~) NOT AT ARMC
CHLAMYDIA, DNA PROBE: NEGATIVE
Neisseria Gonorrhea: NEGATIVE

## 2016-09-28 LAB — CULTURE, BETA STREP (GROUP B ONLY)

## 2016-10-02 ENCOUNTER — Ambulatory Visit (INDEPENDENT_AMBULATORY_CARE_PROVIDER_SITE_OTHER): Payer: Self-pay | Admitting: Clinical

## 2016-10-02 ENCOUNTER — Ambulatory Visit (INDEPENDENT_AMBULATORY_CARE_PROVIDER_SITE_OTHER): Payer: Self-pay | Admitting: Obstetrics & Gynecology

## 2016-10-02 VITALS — BP 102/67 | HR 94 | Wt 153.0 lb

## 2016-10-02 DIAGNOSIS — F321 Major depressive disorder, single episode, moderate: Secondary | ICD-10-CM

## 2016-10-02 DIAGNOSIS — Z3483 Encounter for supervision of other normal pregnancy, third trimester: Secondary | ICD-10-CM

## 2016-10-02 NOTE — Progress Notes (Signed)
Has not gotten acyclovir yet- forgot to pick it up. Per phq9 score informed her would have clinician see her today after her ob visit.

## 2016-10-02 NOTE — BH Specialist Note (Signed)
Session Start time: 1:40   End Time: 2:40pm Total Time:  60 minutes Type of Service: Behavioral Health - Individual/Family Interpreter: No.   Interpreter Name & Language: n/a # [facility]   SUBJECTIVE: [name] Richards is a 20 y.o. female  Pt.  f/u:  depression. Pt. reports the following symptoms/concerns: Pt states that she is continuing to feel more depressed, but does not want to take medication until after baby is born; willing to consider other coping strategies until birth. Duration of problem:  Increase over three months Severity: severe Previous treatment: none   OBJECTIVE: Mood: Depressed & Affect: Depressed Risk of harm to self or others: no known risk of harm to self or others Assessments administered: PHQ9: 17/ GAD7: 8  LIFE CONTEXT:  Family & Social: Continues to live with her family, while FOB lives with his family; goal is to live together  Product/process development scientistchool/ Work: Was on honor roll until she left school as senior, friends not supportive of educational pursuit  Self-Care: No interest in activities  Life changes: Current pregnancy What is important to pt/family (values): Family important   GOALS ADDRESSED:  -Alleviate symptoms   INTERVENTIONS: Solution-Focused   ASSESSMENT:  Pt currently experiencing Major depressive disorder, single episode, moderate.  Pt may benefit from brief therapeutic interventions regarding coping with depression.    PLAN: 1. F/U with behavioral health clinician: Two weeks 2. Behavioral Health meds: none 3. Behavioral recommendations:  -Continue watching movie daily with brother -Spend 20 minutes/day with brother learning to play video game -Consider taking up reading, painting, and/or board games again -Consider writing out ten-year life plan, together with FOB 4. Referral: Brief Counseling/Psychotherapy 5. From scale of 1-10, how likely are you to follow plan: uncertain Depression screen [facility] 2/9 10/02/2016 09/11/2016  08/01/2016  Decreased Interest 3 3 3   Down, Depressed, Hopeless 3 2 1   PHQ - 2 Score 6 5 4   Altered sleeping 0 1 3  Tired, decreased energy 1 2 3   Change in appetite 1 1 0  Feeling bad or failure about yourself  3 0 2  Trouble concentrating 3 1 0  Moving slowly or fidgety/restless 3 3 2   Suicidal thoughts 0 0 0  PHQ-9 Score 17 13 14    GAD 7 : Generalized Anxiety Score 10/02/2016 09/11/2016 08/01/2016  Nervous, Anxious, on Edge 0 0 2  Control/stop worrying 1 0 2  Worry too much - different things 1 0 3  Trouble relaxing 3 3 3   Restless 0 0 0  Easily annoyed or irritable 3 3 3   Afraid - awful might happen 0 0 2  Total GAD 7 Score 8 6 15       Rae LipsJamie C Mcmannes LCSWA Behavioral Health Clinician  Warmhandoff: No

## 2016-10-02 NOTE — Progress Notes (Signed)
   PRENATAL VISIT NOTE  Subjective:will see [name] MuirJamie Richards today  [name] MonsDeysi Garald Richards is a 20 y.o. G2P1001 at [redacted]w[redacted]d being seen today for ongoing prenatal care.  She is currently monitored for the following issues for this low-risk pregnancy and has Supervision of normal pregnancy in third trimester; Genital herpes affecting pregnancy; Late prenatal care; and Asymptomatic bacteriuria on her problem list.  Patient reports no complaints.  Contractions: Not present. Vag. Bleeding: None.  Movement: Present. Denies leaking of fluid.   The following portions of the patient's history were reviewed and updated as appropriate: allergies, current medications, past family history, past medical history, past social history, past surgical history and problem list. Problem list updated.  Objective:   Vitals:   10/02/16 1254  BP: 102/67  Pulse: 94  Weight: 153 lb (69.4 kg)    Fetal Status: Fetal Heart Rate (bpm): 144   Movement: Present     General:  Alert, oriented and cooperative. Patient is in no acute distress.  Skin: Skin is warm and dry. No rash noted.   Cardiovascular: Normal heart rate noted  Respiratory: Normal respiratory effort, no problems with respiration noted  Abdomen: Soft, gravid, appropriate for gestational age. Pain/Pressure: Present     Pelvic:  Cervical exam deferred        Extremities: Normal range of motion.  Edema: Trace  Mental Status: Normal mood and affect. Normal behavior. Normal judgment and thought content.   Urinalysis:      Assessment and Plan:  Pregnancy: G2P1001 at [redacted]w[redacted]d  There are no diagnoses linked to this encounter. Term labor symptoms and general obstetric precautions including but not limited to vaginal bleeding, contractions, leaking of fluid and fetal movement were reviewed in detail with the patient. Please refer to After Visit Summary for other counseling recommendations.  Return in about 1 week (around 10/09/2016).  Adam PhenixJames G Arnold, MD

## 2016-10-07 ENCOUNTER — Inpatient Hospital Stay (HOSPITAL_COMMUNITY)
Admission: AD | Admit: 2016-10-07 | Discharge: 2016-10-07 | Disposition: A | Discharge status: Home / Self Care | Payer: Medicaid Other | Source: Ambulatory Visit | Attending: Obstetrics and Gynecology | Admitting: Obstetrics and Gynecology

## 2016-10-07 ENCOUNTER — Encounter (HOSPITAL_COMMUNITY): Payer: Self-pay | Admitting: Certified Nurse Midwife

## 2016-10-07 DIAGNOSIS — Z3A37 37 weeks gestation of pregnancy: Secondary | ICD-10-CM | POA: Diagnosis not present

## 2016-10-07 DIAGNOSIS — B373 Candidiasis of vulva and vagina: Secondary | ICD-10-CM

## 2016-10-07 DIAGNOSIS — B379 Candidiasis, unspecified: Secondary | ICD-10-CM

## 2016-10-07 DIAGNOSIS — O26893 Other specified pregnancy related conditions, third trimester: Secondary | ICD-10-CM | POA: Diagnosis present

## 2016-10-07 DIAGNOSIS — O98813 Other maternal infectious and parasitic diseases complicating pregnancy, third trimester: Secondary | ICD-10-CM | POA: Diagnosis not present

## 2016-10-07 DIAGNOSIS — N898 Other specified noninflammatory disorders of vagina: Secondary | ICD-10-CM

## 2016-10-07 DIAGNOSIS — O368130 Decreased fetal movements, third trimester, not applicable or unspecified: Secondary | ICD-10-CM

## 2016-10-07 LAB — WET PREP, GENITAL
Clue Cells Wet Prep HPF POC: NONE SEEN
Sperm: NONE SEEN
Trich, Wet Prep: NONE SEEN
Yeast Wet Prep HPF POC: NONE SEEN

## 2016-10-07 MED ORDER — MICONAZOLE NITRATE 2 % VA CREA: 1.0000 | TOPICAL_CREAM | Freq: Every day | VAGINAL | 0 refills | Status: DC

## 2016-10-07 NOTE — Discharge Instructions (Signed)
Monilial Vaginitis Vaginitis in a soreness, swelling and redness (inflammation) of the vagina and vulva. Monilial vaginitis is not a sexually transmitted infection. CAUSES  Yeast vaginitis is caused by yeast (candida) that is normally found in your vagina. With a yeast infection, the candida has overgrown in number to a point that upsets the chemical balance. SYMPTOMS   White, thick vaginal discharge.  Swelling, itching, redness and irritation of the vagina and possibly the lips of the vagina (vulva).  Burning or painful urination.  Painful intercourse. DIAGNOSIS  Things that may contribute to monilial vaginitis are:  Postmenopausal and virginal states.  Pregnancy.  Infections.  Being tired, sick or stressed, especially if you had monilial vaginitis in the past.  Diabetes. Good control will help lower the chance.  Birth control pills.  Tight fitting garments.  Using bubble bath, feminine sprays, douches or deodorant tampons.  Taking certain medications that kill germs (antibiotics).  Sporadic recurrence can occur if you become ill. TREATMENT  Your caregiver will give you medication.  There are several kinds of anti monilial vaginal creams and suppositories specific for monilial vaginitis. For recurrent yeast infections, use a suppository or cream in the vagina 2 times a week, or as directed.  Anti-monilial or steroid cream for the itching or irritation of the vulva may also be used. Get your caregiver's permission.  Painting the vagina with methylene blue solution may help if the monilial cream does not work.  Eating yogurt may help prevent monilial vaginitis. HOME CARE INSTRUCTIONS   Finish all medication as prescribed.  Do not have sex until treatment is completed or after your caregiver tells you it is okay.  Take warm sitz baths.  Do not douche.  Do not use tampons, especially scented ones.  Wear cotton underwear.  Avoid tight pants and panty  hose.  Tell your sexual partner that you have a yeast infection. They should go to their caregiver if they have symptoms such as mild rash or itching.  Your sexual partner should be treated as well if your infection is difficult to eliminate.  Practice safer sex. Use condoms.  Some vaginal medications cause latex condoms to fail. Vaginal medications that harm condoms are:  Cleocin cream.  Butoconazole (Femstat).  Terconazole (Terazol) vaginal suppository.  Miconazole (Monistat) (may be purchased over the counter). SEEK MEDICAL CARE IF:   You have a temperature by mouth above 102 F (38.9 C).  The infection is getting worse after 2 days of treatment.  The infection is not getting better after 3 days of treatment.  You develop blisters in or around your vagina.  You develop vaginal bleeding, and it is not your menstrual period.  You have pain when you urinate.  You develop intestinal problems.  You have pain with sexual intercourse.   This information is not intended to replace advice given to you by your health care provider. Make sure you discuss any questions you have with your health care provider.   Document Released: 09/20/2005 Document Revised: 03/04/2012 Document Reviewed: 06/14/2015 Elsevier Interactive Patient Education 2016 Elsevier Inc.   Fetal Movement Counts Patient Name: __________________________________________________ Patient Due Date: ____________________ Performing a fetal movement count is highly recommended in high-risk pregnancies, but it is good for every pregnant woman to do. Your health care provider may ask you to start counting fetal movements at 28 weeks of the pregnancy. Fetal movements often increase:  After eating a full meal.  After physical activity.  After eating or drinking something sweet or cold.  At rest. Pay attention to when you feel the baby is most active. This will help you notice a pattern of your baby's sleep and  wake cycles and what factors contribute to an increase in fetal movement. It is important to perform a fetal movement count at the same time each day when your baby is normally most active.  HOW TO COUNT FETAL MOVEMENTS 1. Find a quiet and comfortable area to sit or lie down on your left side. Lying on your left side provides the best blood and oxygen circulation to your baby. 2. Write down the day and time on a sheet of paper or in a journal. 3. Start counting kicks, flutters, swishes, rolls, or jabs in a 2-hour period. You should feel at least 10 movements within 2 hours. 4. If you do not feel 10 movements in 2 hours, wait 2-3 hours and count again. Look for a change in the pattern or not enough counts in 2 hours. SEEK MEDICAL CARE IF:  You feel less than 10 counts in 2 hours, tried twice.  There is no movement in over an hour.  The pattern is changing or taking longer each day to reach 10 counts in 2 hours.  You feel the baby is not moving as he or she usually does. Date: ____________ Movements: ____________ Start time: ____________ Doreatha Martin time: ____________  Date: ____________ Movements: ____________ Start time: ____________ Doreatha Martin time: ____________ Date: ____________ Movements: ____________ Start time: ____________ Doreatha Martin time: ____________ Date: ____________ Movements: ____________ Start time: ____________ Doreatha Martin time: ____________ Date: ____________ Movements: ____________ Start time: ____________ Doreatha Martin time: ____________ Date: ____________ Movements: ____________ Start time: ____________ Doreatha Martin time: ____________ Date: ____________ Movements: ____________ Start time: ____________ Doreatha Martin time: ____________ Date: ____________ Movements: ____________ Start time: ____________ Doreatha Martin time: ____________  Date: ____________ Movements: ____________ Start time: ____________ Doreatha Martin time: ____________ Date: ____________ Movements: ____________ Start time: ____________ Doreatha Martin time:  ____________ Date: ____________ Movements: ____________ Start time: ____________ Doreatha Martin time: ____________ Date: ____________ Movements: ____________ Start time: ____________ Doreatha Martin time: ____________ Date: ____________ Movements: ____________ Start time: ____________ Doreatha Martin time: ____________ Date: ____________ Movements: ____________ Start time: ____________ Doreatha Martin time: ____________ Date: ____________ Movements: ____________ Start time: ____________ Doreatha Martin time: ____________  Date: ____________ Movements: ____________ Start time: ____________ Doreatha Martin time: ____________ Date: ____________ Movements: ____________ Start time: ____________ Doreatha Martin time: ____________ Date: ____________ Movements: ____________ Start time: ____________ Doreatha Martin time: ____________ Date: ____________ Movements: ____________ Start time: ____________ Doreatha Martin time: ____________ Date: ____________ Movements: ____________ Start time: ____________ Doreatha Martin time: ____________ Date: ____________ Movements: ____________ Start time: ____________ Doreatha Martin time: ____________ Date: ____________ Movements: ____________ Start time: ____________ Doreatha Martin time: ____________  Date: ____________ Movements: ____________ Start time: ____________ Doreatha Martin time: ____________ Date: ____________ Movements: ____________ Start time: ____________ Doreatha Martin time: ____________ Date: ____________ Movements: ____________ Start time: ____________ Doreatha Martin time: ____________ Date: ____________ Movements: ____________ Start time: ____________ Doreatha Martin time: ____________ Date: ____________ Movements: ____________ Start time: ____________ Doreatha Martin time: ____________ Date: ____________ Movements: ____________ Start time: ____________ Doreatha Martin time: ____________ Date: ____________ Movements: ____________ Start time: ____________ Doreatha Martin time: ____________  Date: ____________ Movements: ____________ Start time: ____________ Doreatha Martin time: ____________ Date: ____________ Movements:  ____________ Start time: ____________ Doreatha Martin time: ____________ Date: ____________ Movements: ____________ Start time: ____________ Doreatha Martin time: ____________ Date: ____________ Movements: ____________ Start time: ____________ Doreatha Martin time: ____________ Date: ____________ Movements: ____________ Start time: ____________ Doreatha Martin time: ____________ Date: ____________ Movements: ____________ Start time: ____________ Doreatha Martin time: ____________ Date: ____________ Movements: ____________ Start time: ____________ Doreatha Martin time: ____________  Date: ____________ Movements: ____________ Start time: ____________ Doreatha Martin time:  ____________ Date: ____________ Movements: ____________ Start time: ____________ Doreatha MartinFinish time: ____________ Date: ____________ Movements: ____________ Start time: ____________ Doreatha MartinFinish time: ____________ Date: ____________ Movements: ____________ Start time: ____________ Doreatha MartinFinish time: ____________ Date: ____________ Movements: ____________ Start time: ____________ Doreatha MartinFinish time: ____________ Date: ____________ Movements: ____________ Start time: ____________ Doreatha MartinFinish time: ____________ Date: ____________ Movements: ____________ Start time: ____________ Doreatha MartinFinish time: ____________  Date: ____________ Movements: ____________ Start time: ____________ Doreatha MartinFinish time: ____________ Date: ____________ Movements: ____________ Start time: ____________ Doreatha MartinFinish time: ____________ Date: ____________ Movements: ____________ Start time: ____________ Doreatha MartinFinish time: ____________ Date: ____________ Movements: ____________ Start time: ____________ Doreatha MartinFinish time: ____________ Date: ____________ Movements: ____________ Start time: ____________ Doreatha MartinFinish time: ____________ Date: ____________ Movements: ____________ Start time: ____________ Doreatha MartinFinish time: ____________ Date: ____________ Movements: ____________ Start time: ____________ Doreatha MartinFinish time: ____________  Date: ____________ Movements: ____________ Start time: ____________ Doreatha MartinFinish  time: ____________ Date: ____________ Movements: ____________ Start time: ____________ Doreatha MartinFinish time: ____________ Date: ____________ Movements: ____________ Start time: ____________ Doreatha MartinFinish time: ____________ Date: ____________ Movements: ____________ Start time: ____________ Doreatha MartinFinish time: ____________ Date: ____________ Movements: ____________ Start time: ____________ Doreatha MartinFinish time: ____________ Date: ____________ Movements: ____________ Start time: ____________ Doreatha MartinFinish time: ____________   This information is not intended to replace advice given to you by your health care provider. Make sure you discuss any questions you have with your health care provider.   Document Released: 01/10/2007 Document Revised: 01/01/2015 Document Reviewed: 10/07/2012 Elsevier Interactive Patient Education [employer]2016 Elsevier Inc.

## 2016-10-07 NOTE — MAU Provider Note (Signed)
First Provider Initiated Contact with Patient 10/07/16 1531      S: [name]  is a 20 y.o. y.o. year old 102P1001 female at 679w6d weeks gestation who presents to [facility]U reporting leaking of small amoutn of white fluid since this morning.  Contractions: irreg, mild Vaginal bleeding: None Fetal movement: Decreased  O: Patient Vitals for the past 24 hrs:  BP Temp src Pulse Resp  10/07/16 1518 106/67 - 93 18  10/07/16 1413 112/63 Oral 102 18   General: NAD Heart: Regular rate Lungs: Normal rate and effort Abd: Soft, NT, Gravid, S=D Pelvic: NEFG, moderate amount of thick, white, odorless, curd-like discharge, neg pooling, no blood.  Dilation: Fingertip Effacement (%): Thick Cervical Position: Posterior Station: Ballotable Exam by:: AYetta Barre. Jones RNC  EFM: 150, reactive Toco: Irreg, mild  Neg Fern Results for orders placed or performed during the hospital encounter of 10/07/16 (from the past 24 hour(s))  Wet prep, genital     Status: Abnormal   Collection Time: 10/07/16  3:22 PM  Result Value Ref Range   Yeast Wet Prep HPF POC NONE SEEN NONE SEEN   Trich, Wet Prep NONE SEEN NONE SEEN   Clue Cells Wet Prep HPF POC NONE SEEN NONE SEEN   WBC, Wet Prep HPF POC MANY (A) NONE SEEN   Sperm NONE SEEN       A:  4879w6d week IUP No evidence of SROM Clinical Dx of yeast infection Decreased FM resolved. FHR reactive  P: D/C home in stable condition OTC Monistat 7  [place] Ferron Ishmael, [place] 10/07/2016 3:31 PM

## 2016-10-07 NOTE — MAU Note (Signed)
Pt states she has not felt the baby move in 2 days. Pt denies vaginal bleeding or LOF. Pt states she has had one occasion on white discharge. Pt denies ctxs or cramping.

## 2016-10-11 ENCOUNTER — Ambulatory Visit (INDEPENDENT_AMBULATORY_CARE_PROVIDER_SITE_OTHER): Payer: Self-pay | Admitting: Family Medicine

## 2016-10-11 VITALS — BP 119/70 | HR 93 | Wt 156.0 lb

## 2016-10-11 DIAGNOSIS — Z3493 Encounter for supervision of normal pregnancy, unspecified, third trimester: Secondary | ICD-10-CM

## 2016-10-11 DIAGNOSIS — B009 Herpesviral infection, unspecified: Secondary | ICD-10-CM

## 2016-10-11 NOTE — Progress Notes (Signed)
   PRENATAL VISIT NOTE  Subjective:  [name] Richards is a 20 y.o. G2P1001 at 876w3d being seen today for ongoing prenatal care.  She is currently monitored for the following issues for this low-risk pregnancy and has Supervision of normal pregnancy in third trimester; Genital herpes affecting pregnancy; Late prenatal care; and Asymptomatic bacteriuria on her problem list.  Patient reports no complaints.  Contractions: Irritability. Vag. Bleeding: None.  Movement: Present. Denies leaking of fluid.   Taking Acyclovir. No current outbreaks. Tolerating medication well.  The following portions of the patient's history were reviewed and updated as appropriate: allergies, current medications, past family history, past medical history, past social history, past surgical history and problem list. Problem list updated.  Objective:   Vitals:   10/11/16 1307  BP: 119/70  Pulse: 93  Weight: 156 lb (70.8 kg)    Fetal Status: Fetal Heart Rate (bpm): 145 Fundal Height: 38 cm Movement: Present     General:  Alert, oriented and cooperative. Patient is in no acute distress.  Skin: Skin is warm and dry. No rash noted.   Cardiovascular: Normal heart rate noted  Respiratory: Normal respiratory effort, no problems with respiration noted  Abdomen: Soft, gravid, appropriate for gestational age. Pain/Pressure: Present     Pelvic:  Cervical exam deferred        Extremities: Normal range of motion.  Edema: Trace  Mental Status: Normal mood and affect. Normal behavior. Normal judgment and thought content.   Assessment and Plan:  Pregnancy: G2P1001 at 6876w3d  1. Supervision of low-risk pregnancy, third trimester Normal FH and FHT.  2. HSV (herpes simplex virus) infection Continue acyclovir.  Term labor symptoms and general obstetric precautions including but not limited to vaginal bleeding, contractions, leaking of fluid and fetal movement were reviewed in detail with the patient. Please refer to After Visit  Summary for other counseling recommendations.  Return in about 1 week (around 10/18/2016) for LR OB f/u.  Levie HeritageJacob J Stinson, DO

## 2016-10-18 ENCOUNTER — Ambulatory Visit (INDEPENDENT_AMBULATORY_CARE_PROVIDER_SITE_OTHER): Payer: Self-pay | Admitting: Obstetrics and Gynecology

## 2016-10-18 VITALS — BP 116/69 | HR 79 | Wt 161.4 lb

## 2016-10-18 DIAGNOSIS — A6009 Herpesviral infection of other urogenital tract: Secondary | ICD-10-CM

## 2016-10-18 DIAGNOSIS — O0933 Supervision of pregnancy with insufficient antenatal care, third trimester: Secondary | ICD-10-CM

## 2016-10-18 DIAGNOSIS — Z3483 Encounter for supervision of other normal pregnancy, third trimester: Secondary | ICD-10-CM

## 2016-10-18 DIAGNOSIS — O98313 Other infections with a predominantly sexual mode of transmission complicating pregnancy, third trimester: Secondary | ICD-10-CM

## 2016-10-18 DIAGNOSIS — O0930 Supervision of pregnancy with insufficient antenatal care, unspecified trimester: Secondary | ICD-10-CM

## 2016-10-18 DIAGNOSIS — A609 Anogenital herpesviral infection, unspecified: Secondary | ICD-10-CM

## 2016-10-18 NOTE — Progress Notes (Signed)
   PRENATAL VISIT NOTE  Subjective:  [name] Richards is a 20 y.o. G2P1001 at 5751w3d being seen today for ongoing prenatal care.  She is currently monitored for the following issues for this low-risk pregnancy and has Supervision of normal pregnancy in third trimester; Genital herpes affecting pregnancy; Late prenatal care; and Asymptomatic bacteriuria on her problem list.  Patient reports no complaints.  Contractions: Irritability. Vag. Bleeding: None.  Movement: Present. Denies leaking of fluid.   The following portions of the patient's history were reviewed and updated as appropriate: allergies, current medications, past family history, past medical history, past social history, past surgical history and problem list. Problem list updated.  Objective:   Vitals:   10/18/16 1307  BP: 116/69  Pulse: 79  Weight: 161 lb 6.4 oz (73.2 kg)    Fetal Status: Fetal Heart Rate (bpm): 147 Fundal Height: 39 cm Movement: Present     General:  Alert, oriented and cooperative. Patient is in no acute distress.  Skin: Skin is warm and dry. No rash noted.   Cardiovascular: Normal heart rate noted  Respiratory: Normal respiratory effort, no problems with respiration noted  Abdomen: Soft, gravid, appropriate for gestational age. Pain/Pressure: Present     Pelvic:  Cervical exam deferred        Extremities: Normal range of motion.  Edema: Trace  Mental Status: Normal mood and affect. Normal behavior. Normal judgment and thought content.   Assessment and Plan:  Pregnancy: G2P1001 at 2751w3d  1. Encounter for supervision of other normal pregnancy in third trimester Patient is doing well without complaints Discussed IOL at 41 weeks if no spontaneous onset of labor on 11/5 Post date fetal testing at next visit  2. Late prenatal care   3. Genital herpes affecting pregnancy in third trimester Continue Acyclovir daily for prophylaxis  Term labor symptoms and general obstetric precautions including but not  limited to vaginal bleeding, contractions, leaking of fluid and fetal movement were reviewed in detail with the patient. Please refer to After Visit Summary for other counseling recommendations.  Return in about 1 week (around 10/25/2016) for ROB with NST and AFI.  Catalina AntiguaPeggy Preet Perrier, MD

## 2016-10-19 ENCOUNTER — Telehealth (HOSPITAL_COMMUNITY): Payer: Self-pay | Admitting: *Deleted

## 2016-10-19 NOTE — Telephone Encounter (Signed)
Preadmission screen  

## 2016-10-23 ENCOUNTER — Observation Stay
Admission: EM | Admit: 2016-10-23 | Discharge: 2016-10-23 | Disposition: A | Discharge status: Home / Self Care | Payer: Medicaid Other | Attending: Obstetrics & Gynecology | Admitting: Obstetrics & Gynecology

## 2016-10-23 ENCOUNTER — Inpatient Hospital Stay (HOSPITAL_COMMUNITY)
Admission: AD | Admit: 2016-10-23 | Discharge: 2016-10-23 | Disposition: A | Discharge status: Home / Self Care | Payer: Self-pay | Source: Ambulatory Visit | Attending: Family Medicine | Admitting: Family Medicine

## 2016-10-23 ENCOUNTER — Encounter (HOSPITAL_COMMUNITY): Payer: Self-pay | Admitting: *Deleted

## 2016-10-23 ENCOUNTER — Inpatient Hospital Stay (HOSPITAL_COMMUNITY)
Admission: AD | Admit: 2016-10-23 | Discharge: 2016-10-28 | DRG: 775 | Disposition: A | Discharge status: Home / Self Care | Payer: Medicaid Other | Source: Ambulatory Visit | Attending: Family Medicine | Admitting: Family Medicine

## 2016-10-23 ENCOUNTER — Encounter: Payer: Self-pay | Admitting: *Deleted

## 2016-10-23 DIAGNOSIS — B009 Herpesviral infection, unspecified: Secondary | ICD-10-CM | POA: Insufficient documentation

## 2016-10-23 DIAGNOSIS — O471 False labor at or after 37 completed weeks of gestation: Principal | ICD-10-CM | POA: Insufficient documentation

## 2016-10-23 DIAGNOSIS — Z79899 Other long term (current) drug therapy: Secondary | ICD-10-CM | POA: Diagnosis not present

## 2016-10-23 DIAGNOSIS — Z3A40 40 weeks gestation of pregnancy: Secondary | ICD-10-CM | POA: Diagnosis not present

## 2016-10-23 DIAGNOSIS — O8620 Urinary tract infection following delivery, unspecified: Secondary | ICD-10-CM | POA: Diagnosis not present

## 2016-10-23 DIAGNOSIS — O4202 Full-term premature rupture of membranes, onset of labor within 24 hours of rupture: Principal | ICD-10-CM | POA: Diagnosis present

## 2016-10-23 DIAGNOSIS — O864 Pyrexia of unknown origin following delivery: Secondary | ICD-10-CM | POA: Diagnosis not present

## 2016-10-23 DIAGNOSIS — O98513 Other viral diseases complicating pregnancy, third trimester: Secondary | ICD-10-CM | POA: Diagnosis not present

## 2016-10-23 DIAGNOSIS — Z833 Family history of diabetes mellitus: Secondary | ICD-10-CM

## 2016-10-23 LAB — CHLAMYDIA/NGC RT PCR (ARMC ONLY)
CHLAMYDIA TR: NOT DETECTED
N gonorrhoeae: NOT DETECTED

## 2016-10-23 LAB — POCT FERN TEST: POCT Fern Test: NEGATIVE

## 2016-10-23 LAB — OB RESULTS CONSOLE GBS: GBS: NEGATIVE

## 2016-10-23 NOTE — Discharge Instructions (Signed)
Braxton Hicks Contractions °Contractions of the uterus can occur throughout pregnancy. Contractions are not always a sign that you are in labor.  °WHAT ARE BRAXTON HICKS CONTRACTIONS?  °Contractions that occur before labor are called Braxton Hicks contractions, or false labor. Toward the end of pregnancy (32-34 weeks), these contractions can develop more often and may become more forceful. This is not true labor because these contractions do not result in opening (dilatation) and thinning of the cervix. They are sometimes difficult to tell apart from true labor because these contractions can be forceful and people have different pain tolerances. You should not feel embarrassed if you go to the hospital with false labor. Sometimes, the only way to tell if you are in true labor is for your health care provider to look for changes in the cervix. °If there are no prenatal problems or other health problems associated with the pregnancy, it is completely safe to be sent home with false labor and await the onset of true labor. °HOW CAN YOU TELL THE DIFFERENCE BETWEEN TRUE AND FALSE LABOR? °False Labor °· The contractions of false labor are usually shorter and not as hard as those of true labor.   °· The contractions are usually irregular.   °· The contractions are often felt in the front of the lower abdomen and in the groin.   °· The contractions may go away when you walk around or change positions while lying down.   °· The contractions get weaker and are shorter lasting as time goes on.   °· The contractions do not usually become progressively stronger, regular, and closer together as with true labor.   °True Labor °· Contractions in true labor last 30-70 seconds, become very regular, usually become more intense, and increase in frequency.   °· The contractions do not go away with walking.   °· The discomfort is usually felt in the top of the uterus and spreads to the lower abdomen and low back.   °· True labor can be  determined by your health care provider with an exam. This will show that the cervix is dilating and getting thinner.   °WHAT TO REMEMBER °· Keep up with your usual exercises and follow other instructions given by your health care provider.   °· Take medicines as directed by your health care provider.   °· Keep your regular prenatal appointments.   °· Eat and drink lightly if you think you are going into labor.   °· If Braxton Hicks contractions are making you uncomfortable:   °¨ Change your position from lying down or resting to walking, or from walking to resting.   °¨ Sit and rest in a tub of warm water.   °¨ Drink 2-3 glasses of water. Dehydration may cause these contractions.   °¨ Do slow and deep breathing several times an hour.   °WHEN SHOULD I SEEK IMMEDIATE MEDICAL CARE? °Seek immediate medical care if: °· Your contractions become stronger, more regular, and closer together.   °· You have fluid leaking or gushing from your vagina.   °· You have a fever.   °· You pass blood-tinged mucus.   °· You have vaginal bleeding.   °· You have continuous abdominal pain.   °· You have low back pain that you never had before.   °· You feel your baby's head pushing down and causing pelvic pressure.   °· Your baby is not moving as much as it used to.   °  °This information is not intended to replace advice given to you by your health care provider. Make sure you discuss any questions you have with your health care   provider. °  °Document Released: 12/11/2005 Document Revised: 12/16/2013 Document Reviewed: 09/22/2013 °Elsevier Interactive Patient Education ©2016 Elsevier Inc. ° °

## 2016-10-23 NOTE — Discharge Summary (Addendum)
Pt d/c'd to home with family in stable condition. Given labor precautions , verbalized understanding.

## 2016-10-23 NOTE — Progress Notes (Signed)
Discharged to home with labor precautions.  Verbalized understanding.  Questions answered.

## 2016-10-23 NOTE — Discharge Summary (Signed)
OB History & Physical   History of Present Illness:  Chief Complaint:   HPI:  [name] Richards is a 20 y.o. 862P1001 female at 6123w1d dated by 4017 week US with EDC of 10/22/16.  She presents to L&D with painful contractions, worsening and increasing in frequency.  +FM, +CTX, no LOF, no VB  Pregnancy Issues: 1. Short interval pregnancy 2. H/O HSV, on acyclovir since 36wks, denies recent flu-like illness 3. Resolved low lying placenta 4. Bacteruria and yeast infection in pregnancy  Maternal Medical History:   Past Medical History:  Diagnosis Date  . Herpes   . Medical history non-contributory     Past Surgical History:  Procedure Laterality Date  . EXCISION VAGINAL CYST      No Known Allergies  Prior to Admission medications   Medication Sig Start Date End Date Taking? Authorizing Provider  acyclovir (ZOVIRAX) 400 MG tablet Take 1 tablet (400 mg total) by mouth 3 (three) times daily. 09/25/16   Tereso NewcomerUgonna A Anyanwu, MD  Prenatal Multivit-Min-Fe-FA (PRENATAL VITAMINS) 0.8 MG tablet Take 1 tablet by mouth daily. 05/16/16   Shon Batonourtney F Horton, MD     [facility]  [facility]  Social History: She  reports that she has never smoked. She has never used smokeless tobacco. She reports that she does not drink alcohol or use drugs.  Family History: family history includes Asthma in her father; Diabetes in her maternal grandfather and maternal grandmother.   Review of Systems: A full review of systems was performed and negative except as noted in the HPI.     Physical Exam:  Vital Signs: BP 126/79   Pulse 87   Temp 98.2 F (36.8 C) (Oral)   LMP 12/23/2015 Comment: No period since baby born in September General: no acute distress.  HEENT: normocephalic, atraumatic Heart: regular rate & rhythm.  No murmurs/rubs/gallops Lungs: clear to auscultation bilaterally, normal respiratory effort Abdomen: soft, gravid, non-tender;  EFW: 7.3 Pelvic:   External:  Normal external female genitalia, no visible lesions  Cervix: Dilation: 1.5 / Effacement (%): 60 / Station: Ballotable   SSE: thick white discharge, wiped away, no evidence of ulcerative lesions   Extremities: non-tender, symmetric, 1+ edema bilaterally.  DTRs: 2+  Neurologic: Alert & oriented x 3.    No results found for this or any previous visit (from the past 24 hour(s)).  Pertinent Results:  Prenatal Labs: Blood type/Rh O+  Antibody screen neg  Rubella Immune  Varicella Immune  RPR NR  HBsAg Neg  HIV NR  GC neg  Chlamydia neg  Genetic screening CF neg, otherwise too late  1 hour GTT 82  3 hour GTT --  GBS Negative 09/25/16   FHT: 145 mod + accels no decels TOCO: q2-4 mins SVE:  Dilation: 1.5 / Effacement (%): 60 / Station: Ballotable    Cephalic by leopolds  Assessment:  [name] Richards is a 20 y.o. G2P1001 female at 3623w1d with early labor.   Plan:  1. Observe for cervical change  2. If none, d/c home and await labor ----- no cervical change in 2hrs. 3. She may return here and we will take care of her or she may continue with care at her prenatal site.  ----- Ranae Plumberhelsea Elverna Caffee, MD Attending Obstetrician and Gynecologist [facility], Department of OB/GYN [facility]

## 2016-10-23 NOTE — OB Triage Note (Signed)
G2P1 presents at 4426w1d c/o ctx since 0500. No LOF or vaginal bleeding. +FM. Rates intensity 7/10.

## 2016-10-23 NOTE — MAU Note (Signed)
Contractions all day, leaking of fluid since 1115

## 2016-10-23 NOTE — MAU Note (Signed)
Has been contracting since 0530 this morning. Are getting closer and stronger.  Pinkish mucous. Was 1+/50 this morning.

## 2016-10-24 ENCOUNTER — Inpatient Hospital Stay (HOSPITAL_COMMUNITY): Payer: Medicaid Other | Admitting: Anesthesiology

## 2016-10-24 ENCOUNTER — Encounter (HOSPITAL_COMMUNITY): Payer: Self-pay | Admitting: General Practice

## 2016-10-24 DIAGNOSIS — Z833 Family history of diabetes mellitus: Secondary | ICD-10-CM | POA: Diagnosis not present

## 2016-10-24 DIAGNOSIS — O8620 Urinary tract infection following delivery, unspecified: Secondary | ICD-10-CM | POA: Diagnosis present

## 2016-10-24 DIAGNOSIS — Z3A40 40 weeks gestation of pregnancy: Secondary | ICD-10-CM | POA: Diagnosis not present

## 2016-10-24 DIAGNOSIS — O4202 Full-term premature rupture of membranes, onset of labor within 24 hours of rupture: Secondary | ICD-10-CM | POA: Diagnosis present

## 2016-10-24 LAB — TYPE AND SCREEN
ABO/RH(D): O POS
ANTIBODY SCREEN: NEGATIVE

## 2016-10-24 LAB — POCT FERN TEST: POCT FERN TEST: POSITIVE

## 2016-10-24 LAB — CBC
HCT: 31.6 % — ABNORMAL LOW (ref 36.0–46.0)
HEMOGLOBIN: 10.4 g/dL — AB (ref 12.0–15.0)
MCH: 28.3 pg (ref 26.0–34.0)
MCHC: 32.9 g/dL (ref 30.0–36.0)
MCV: 85.9 fL (ref 78.0–100.0)
Platelets: 183 10*3/uL (ref 150–400)
RBC: 3.68 MIL/uL — AB (ref 3.87–5.11)
RDW: 16.6 % — ABNORMAL HIGH (ref 11.5–15.5)
WBC: 13.3 10*3/uL — AB (ref 4.0–10.5)

## 2016-10-24 LAB — RPR: RPR: NONREACTIVE

## 2016-10-24 MED ORDER — ACETAMINOPHEN 325 MG PO TABS: 650.0000 mg | ORAL_TABLET | ORAL | Status: DC | PRN

## 2016-10-24 MED ORDER — PHENYLEPHRINE 40 MCG/ML (10ML) SYRINGE FOR IV PUSH (FOR BLOOD PRESSURE SUPPORT)
80.0000 ug | PREFILLED_SYRINGE | INTRAVENOUS | Status: DC | PRN
  Filled 2016-10-24: qty 5
  Filled 2016-10-24: qty 10

## 2016-10-24 MED ORDER — FENTANYL CITRATE (PF) 100 MCG/2ML IJ SOLN
100.0000 ug | INTRAMUSCULAR | Status: DC | PRN
  Administered 2016-10-24 (×2): 100 ug via INTRAVENOUS
  Filled 2016-10-24 (×2): qty 2

## 2016-10-24 MED ORDER — PHENYLEPHRINE 40 MCG/ML (10ML) SYRINGE FOR IV PUSH (FOR BLOOD PRESSURE SUPPORT)
80.0000 ug | PREFILLED_SYRINGE | INTRAVENOUS | Status: DC | PRN
  Filled 2016-10-24: qty 5

## 2016-10-24 MED ORDER — FENTANYL 2.5 MCG/ML BUPIVACAINE 1/10 % EPIDURAL INFUSION (WH - ANES)
14.0000 mL/h | INTRAMUSCULAR | Status: DC | PRN
  Administered 2016-10-24 (×2): 14 mL/h via EPIDURAL
  Filled 2016-10-24 (×2): qty 125

## 2016-10-24 MED ORDER — DIPHENHYDRAMINE HCL 50 MG/ML IJ SOLN: 12.5000 mg | INTRAMUSCULAR | Status: DC | PRN

## 2016-10-24 MED ORDER — ZOLPIDEM TARTRATE 5 MG PO TABS: 5.0000 mg | ORAL_TABLET | Freq: Every evening | ORAL | Status: DC | PRN

## 2016-10-24 MED ORDER — IBUPROFEN 600 MG PO TABS
600.0000 mg | ORAL_TABLET | Freq: Four times a day (QID) | ORAL | Status: DC
  Administered 2016-10-24 – 2016-10-28 (×16): 600 mg via ORAL
  Filled 2016-10-24 (×16): qty 1

## 2016-10-24 MED ORDER — LACTATED RINGERS IV SOLN: 500.0000 mL | Freq: Once | INTRAVENOUS | Status: DC

## 2016-10-24 MED ORDER — FENTANYL 2.5 MCG/ML BUPIVACAINE 1/10 % EPIDURAL INFUSION (WH - ANES)
INTRAMUSCULAR | Status: AC
  Filled 2016-10-24: qty 125

## 2016-10-24 MED ORDER — EPHEDRINE 5 MG/ML INJ
10.0000 mg | INTRAVENOUS | Status: DC | PRN
  Filled 2016-10-24: qty 4

## 2016-10-24 MED ORDER — TETANUS-DIPHTH-ACELL PERTUSSIS 5-2.5-18.5 LF-MCG/0.5 IM SUSP: 0.5000 mL | Freq: Once | INTRAMUSCULAR | Status: DC

## 2016-10-24 MED ORDER — BENZOCAINE-MENTHOL 20-0.5 % EX AERO
1.0000 "application " | INHALATION_SPRAY | CUTANEOUS | Status: DC | PRN
  Administered 2016-10-24: 1 via TOPICAL
  Filled 2016-10-24: qty 56

## 2016-10-24 MED ORDER — LACTATED RINGERS IV SOLN
INTRAVENOUS | Status: DC
  Administered 2016-10-24 (×2): via INTRAVENOUS

## 2016-10-24 MED ORDER — OXYCODONE-ACETAMINOPHEN 5-325 MG PO TABS: 2.0000 | ORAL_TABLET | ORAL | Status: DC | PRN

## 2016-10-24 MED ORDER — ONDANSETRON HCL 4 MG/2ML IJ SOLN
4.0000 mg | Freq: Four times a day (QID) | INTRAMUSCULAR | Status: DC | PRN
  Administered 2016-10-24: 4 mg via INTRAVENOUS
  Filled 2016-10-24: qty 2

## 2016-10-24 MED ORDER — TERBUTALINE SULFATE 1 MG/ML IJ SOLN
0.2500 mg | Freq: Once | INTRAMUSCULAR | Status: DC | PRN
  Filled 2016-10-24: qty 1

## 2016-10-24 MED ORDER — OXYTOCIN BOLUS FROM INFUSION
500.0000 mL | Freq: Once | INTRAVENOUS | Status: AC
  Administered 2016-10-24: 500 mL via INTRAVENOUS

## 2016-10-24 MED ORDER — LIDOCAINE HCL (PF) 1 % IJ SOLN
30.0000 mL | INTRAMUSCULAR | Status: DC | PRN
  Filled 2016-10-24: qty 30

## 2016-10-24 MED ORDER — SENNOSIDES-DOCUSATE SODIUM 8.6-50 MG PO TABS
2.0000 | ORAL_TABLET | ORAL | Status: DC
  Administered 2016-10-25 – 2016-10-27 (×3): 2 via ORAL
  Filled 2016-10-24 (×3): qty 2

## 2016-10-24 MED ORDER — LACTATED RINGERS IV SOLN
500.0000 mL | INTRAVENOUS | Status: DC | PRN
  Administered 2016-10-24: 500 mL via INTRAVENOUS

## 2016-10-24 MED ORDER — OXYTOCIN 40 UNITS IN LACTATED RINGERS INFUSION - SIMPLE MED
1.0000 m[IU]/min | INTRAVENOUS | Status: DC
  Administered 2016-10-24: 1 m[IU]/min via INTRAVENOUS

## 2016-10-24 MED ORDER — EPHEDRINE 5 MG/ML INJ
10.0000 mg | INTRAVENOUS | Status: DC | PRN
Start: 2016-10-24 — End: 2016-10-24
  Filled 2016-10-24: qty 4

## 2016-10-24 MED ORDER — WITCH HAZEL-GLYCERIN EX PADS: 1.0000 "application " | MEDICATED_PAD | CUTANEOUS | Status: DC | PRN

## 2016-10-24 MED ORDER — DIBUCAINE 1 % RE OINT: 1.0000 "application " | TOPICAL_OINTMENT | RECTAL | Status: DC | PRN

## 2016-10-24 MED ORDER — COCONUT OIL OIL
1.0000 "application " | TOPICAL_OIL | Status: DC | PRN
  Administered 2016-10-25: 1 via TOPICAL
  Filled 2016-10-24: qty 120

## 2016-10-24 MED ORDER — DIPHENHYDRAMINE HCL 25 MG PO CAPS: 25.0000 mg | ORAL_CAPSULE | Freq: Four times a day (QID) | ORAL | Status: DC | PRN

## 2016-10-24 MED ORDER — ACETAMINOPHEN 325 MG PO TABS
650.0000 mg | ORAL_TABLET | ORAL | Status: DC | PRN
  Administered 2016-10-25 – 2016-10-28 (×9): 650 mg via ORAL
  Filled 2016-10-24 (×9): qty 2

## 2016-10-24 MED ORDER — SOD CITRATE-CITRIC ACID 500-334 MG/5ML PO SOLN: 30.0000 mL | ORAL | Status: DC | PRN

## 2016-10-24 MED ORDER — PHENYLEPHRINE 40 MCG/ML (10ML) SYRINGE FOR IV PUSH (FOR BLOOD PRESSURE SUPPORT)
PREFILLED_SYRINGE | INTRAVENOUS | Status: AC
  Filled 2016-10-24: qty 20

## 2016-10-24 MED ORDER — SIMETHICONE 80 MG PO CHEW: 80.0000 mg | CHEWABLE_TABLET | ORAL | Status: DC | PRN

## 2016-10-24 MED ORDER — OXYCODONE-ACETAMINOPHEN 5-325 MG PO TABS: 1.0000 | ORAL_TABLET | ORAL | Status: DC | PRN

## 2016-10-24 MED ORDER — ONDANSETRON HCL 4 MG PO TABS: 4.0000 mg | ORAL_TABLET | ORAL | Status: DC | PRN

## 2016-10-24 MED ORDER — LACTATED RINGERS IV SOLN
500.0000 mL | Freq: Once | INTRAVENOUS | Status: AC
  Administered 2016-10-24: 500 mL via INTRAVENOUS

## 2016-10-24 MED ORDER — PRENATAL MULTIVITAMIN CH
1.0000 | ORAL_TABLET | Freq: Every day | ORAL | Status: DC
  Administered 2016-10-25 – 2016-10-27 (×3): 1 via ORAL
  Filled 2016-10-24 (×3): qty 1

## 2016-10-24 MED ORDER — LIDOCAINE HCL (PF) 1 % IJ SOLN
INTRAMUSCULAR | Status: DC | PRN
  Administered 2016-10-24: 6 mL via EPIDURAL
  Administered 2016-10-24: 4 mL via EPIDURAL

## 2016-10-24 MED ORDER — ONDANSETRON HCL 4 MG/2ML IJ SOLN: 4.0000 mg | INTRAMUSCULAR | Status: DC | PRN

## 2016-10-24 MED ORDER — OXYTOCIN 40 UNITS IN LACTATED RINGERS INFUSION - SIMPLE MED
2.5000 [IU]/h | INTRAVENOUS | Status: DC
  Administered 2016-10-24: 2.5 [IU]/h via INTRAVENOUS
  Filled 2016-10-24: qty 1000

## 2016-10-24 NOTE — Anesthesia Preprocedure Evaluation (Addendum)
Anesthesia Evaluation  Patient identified by MRN, date of birth, ID band Patient awake    Reviewed: Allergy & Precautions, NPO status , Patient's Chart, lab work & pertinent test results  History of Anesthesia Complications Negative for: history of anesthetic complications  Airway Mallampati: II  TM Distance: >3 FB Neck ROM: Full    Dental  (+) Teeth Intact   Pulmonary neg pulmonary ROS,    breath sounds clear to auscultation       Cardiovascular negative cardio ROS   Rhythm:Regular Rate:Normal     Neuro/Psych negative neurological ROS     GI/Hepatic negative GI ROS, Neg liver ROS,   Endo/Other  negative endocrine ROS  Renal/GU negative Renal ROS     Musculoskeletal   Abdominal   Peds  Hematology negative hematology ROS (+)   Anesthesia Other Findings   Reproductive/Obstetrics (+) Pregnancy                            Anesthesia Physical Anesthesia Plan  ASA: II  Anesthesia Plan: Epidural   Post-op Pain Management:    Induction:   Airway Management Planned: Natural Airway  Additional Equipment:   Intra-op Plan:   Post-operative Plan:   Informed Consent: I have reviewed the patients History and Physical, chart, labs and discussed the procedure including the risks, benefits and alternatives for the proposed anesthesia with the patient or authorized representative who has indicated his/her understanding and acceptance.     Plan Discussed with:   Anesthesia Plan Comments: (I have discussed risks of neuraxial anesthesia including but not limited to infection, bleeding, nerve injury, back pain, headache, seizures, and failure of block. Patient denies bleeding disorders and is not currently anticoagulated. Labs have been reviewed. Risks and benefits discussed. All patient's questions answered.   Platelets 183)        Anesthesia Quick Evaluation

## 2016-10-24 NOTE — Progress Notes (Signed)
Patient ID: [name], female   DOB: 1996/10/18, 20 y.o.   MRN: [id number] Late entry, saw patient earlier but did a delivery before writing note  Doing well, comfortable but feeling some pressure  Vitals:   10/24/16 0959 10/24/16 1029 10/24/16 1100 10/24/16 1129  BP: 124/78 130/78 112/64 127/69  Pulse: (!) 116 (!) 122 (!) 114 (!) 110  Resp: 16  16   Temp: 98.2 F (36.8 C)     TempSrc: Axillary     Weight:      Height:       FHR reassuring UCs spaced to q684min  Dilation: Lip/rim Effacement (%): 80 Cervical Position: Posterior Station: 0 Presentation: Vertex Exam by:: M.Merrill, RN C. Leshowitz, RN  Will start some Pitocin to enhance contraction pattern and anticipate SVD

## 2016-10-24 NOTE — Lactation Note (Signed)
This note was copied from a baby's chart. Lactation Consultation Note  Mother and baby are asleep. Mom briefly reported that BF is going well. She stated she stopped BF her older child at 4 mos related to thrush.  Written information given on support groups and outpatient services.  Follow-up tomorrow when they are awake.  Patient Name: Girl Dolan AmenDeysi Prevo RUEAV'WToday's Date: 10/24/2016 Reason for consult: Initial assessment   Maternal Data    Feeding Feeding Type: Breast Fed Length of feed: 10 min  LATCH Score/Interventions                      Lactation Tools Discussed/Used     Consult Status      Soyla DryerJoseph, Travian Kerner 10/24/2016, 11:20 PM

## 2016-10-24 NOTE — Anesthesia Procedure Notes (Signed)
Epidural Patient location during procedure: OB Start time: 10/24/2016 4:05 AM End time: 10/24/2016 4:12 AM  Staffing Anesthesiologist: Ardeen JourdainALLAN, Katonya Blecher DICKERSON  Preanesthetic Checklist Completed: patient identified, surgical consent, pre-op evaluation, timeout performed, IV checked, risks and benefits discussed and monitors and equipment checked  Epidural Patient position: sitting Prep: site prepped and draped and DuraPrep Patient monitoring: continuous pulse ox and blood pressure Approach: midline Location: L2-L3 Injection technique: LOR air  Needle:  Needle type: Tuohy  Needle gauge: 17 G Needle length: 9 cm and 9 Needle insertion depth: 7 cm Catheter type: closed end flexible Catheter size: 19 Gauge Catheter at skin depth: 12 cm Test dose: negative  Assessment Events: blood not aspirated, injection not painful, no injection resistance, negative IV test and no paresthesia  Additional Notes Epidural placed easily on first attempt.Reason for block:procedure for pain

## 2016-10-24 NOTE — Progress Notes (Signed)
I received consult for Advance Directive. I spoke to nurse as pt was in active labor and was told the request was not urgent.  We will follow up after delivery.  Chaplain Dyanne CarrelKaty Lionel Woodberry, Bcc Pager, [phone] 3:20 PM    10/24/16 1500  Clinical Encounter Type  Visited With Patient not available

## 2016-10-24 NOTE — Plan of Care (Signed)
Problem: Activity: Goal: Risk for activity intolerance will decrease Patient told to not get up without RN

## 2016-10-24 NOTE — Progress Notes (Signed)
S: Patient was seen and examined at the bedside for evaluation of a forebag.  O:  Vitals:   10/24/16 0145 10/24/16 0220 10/24/16 0300 10/24/16 0305  BP: 130/69 125/85 134/83   Pulse: 96 (!) 103 99   Resp: 20 18 18    Temp: 98.7 F (37.1 C)   98.6 F (37 C)  TempSrc: Oral   Oral  Weight:      Height:      On exam cervix remains 5 cm dilated, posterior and minus 2 station with what was felt to be a potential forebag. Rupture was attempted with no discharge of fluid. Although light meconium stained fluid was present on the bed pad after a contraction. Patient would like an epidural when next dose of fentanyl is due  A/P Continue to monitor patient for cervical change. Will reevaluate patient after epidural and reassess for possible pitocin augmentation to shorten the time between contractions to Q 2 minutes.

## 2016-10-24 NOTE — Anesthesia Pain Management Evaluation Note (Signed)
  CRNA Pain Management Visit Note  Patient: [name] Richards, [age] y.o., female  "Hello I am a member of the anesthesia team at [facility]. We have an anesthesia team available at all times to provide care throughout the hospital, including epidural management and anesthesia for C-section. I don't know your plan for the delivery whether it a natural birth, water birth, IV sedation, nitrous supplementation, doula or epidural, but we want to meet your pain goals."   1.Was your pain managed to your expectations on prior hospitalizations?   Yes   2.What is your expectation for pain management during this hospitalization?     Epidural  3.How can we help you reach that goal? Epidural in place and working well  Record the patient's initial score and the patient's pain goal.   Pain: 0  Pain Goal: 4 [facility] wants you to be able to say your pain was always managed very well.  [name] Richards 10/24/2016

## 2016-10-24 NOTE — H&P (Signed)
LABOR AND DELIVERY ADMISSION HISTORY AND PHYSICAL NOTE  [name] AmenDeysi Richards is a 20 y.o. female G2P1001 with IUP at 4833w2d by ultrasound presenting for SROM.   Patient was seen and evaluated at the bedside. Patient presents to the MAU for rupture and progressive contractions. Patient was at her baseline state of health until 0500 this morning when she developed contractions. Contractions were manageable and about 2/10, lower abdominal and achy/crampy in quality. Patient states that at 1115 this morning she was taking a shower when she noticed light brown discharged down her leg and at the bottom of the tub. Discharge did not recur. Contractions continued to worsen throughout the day until later in the evening when she began having trouble walking and using the bathroom because of the pain. Patient denies any nausea, vomiting, Cp, SOB, dizziness, constipation or diarrhea. In the MAU the patient's cervix is 3 cm and posterior, she is contracting Q5-6 minutes, and appears in mild discomfort. She reports positive fetal movement. She denies leakage of fluid or vaginal bleeding.  Past Medical History: Past Medical History:  Diagnosis Date  . Herpes   . Medical history non-contributory     Past Surgical History: Past Surgical History:  Procedure Laterality Date  . EXCISION VAGINAL CYST      Obstetrical History: OB History    Gravida Para Term Preterm AB Living   2 1 1     1    SAB TAB Ectopic Multiple Live Births         0 1      Social History: Social History   Social History  . Marital status: Single    Spouse name: N/A  . Number of children: N/A  . Years of education: N/A   Social History Main Topics  . Smoking status: Never Smoker  . Smokeless tobacco: Never Used  . Alcohol use No  . Drug use: No  . Sexual activity: Yes    Birth control/ protection: None   Other Topics Concern  . None   Social History Narrative  . None    Family History: Family History  Problem Relation  Age of Onset  . Asthma Father   . Diabetes Maternal Grandmother   . Diabetes Maternal Grandfather     Allergies: No Known Allergies  Prescriptions Prior to Admission  Medication Sig Dispense Refill Last Dose  . Prenatal Multivit-Min-Fe-FA (PRENATAL VITAMINS) 0.8 MG tablet Take 1 tablet by mouth daily. 30 tablet 12 Past Month at Unknown time  . acyclovir (ZOVIRAX) 400 MG tablet Take 1 tablet (400 mg total) by mouth 3 (three) times daily. 90 tablet 4 Unknown at Unknown time     Review of Systems   All systems reviewed and negative except as stated in HPI  Blood pressure 125/85, pulse (!) 103, temperature 98.7 F (37.1 C), temperature source Oral, resp. rate 18, height 5' 2.5" (1.588 m), weight 162 lb (73.5 kg), last menstrual period 12/23/2015, not currently breastfeeding. General appearance: alert, cooperative, appears stated age, mild distress and moderate distress Heart: regular rate and rhythm with intact distal pulses Extremities: No calf swelling or tenderness Presentation: Vertex Fetal monitoring: Variable decelerations -> Category 1 Uterine activity: Q5-6 min Dilation: 5 Effacement (%): 80 Station: -2 Exam by:: AThersa Salt. Schwarz Rn    Prenatal labs: ABO, Rh: --/--/O POS (10/31 0110) Antibody: NEG (10/31 0110) Rubella: !Error! RPR: NON REAC (08/08 0854)  HBsAg: NEGATIVE (07/13 1509)  HIV: NONREACTIVE (08/08 0854)  GBS: Negative (10/30 1308)  1 hr Glucola: 82  Genetic screening:  None Anatomy Koreas: @ 17 weeks  Prenatal Transfer Tool  Maternal Diabetes: No Genetic Screening: None Maternal Ultrasounds/Referrals: Normal Fetal Ultrasounds or other Referrals:  None Maternal Substance Abuse:  No Significant Maternal Medications:  None Significant Maternal Lab Results: None  Results for orders placed or performed during the hospital encounter of 10/23/16 (from the past 24 hour(s))  Fern Test   Collection Time: 10/23/16 11:59 PM  Result Value Ref Range   POCT Fern Test  Negative = intact amniotic membranes   CBC   Collection Time: 10/24/16  1:10 AM  Result Value Ref Range   WBC 13.3 (H) 4.0 - 10.5 K/uL   RBC 3.68 (L) 3.87 - 5.11 MIL/uL   Hemoglobin 10.4 (L) 12.0 - 15.0 g/dL   HCT 40.931.6 (L) 81.136.0 - 91.446.0 %   MCV 85.9 78.0 - 100.0 fL   MCH 28.3 26.0 - 34.0 pg   MCHC 32.9 30.0 - 36.0 g/dL   RDW 78.216.6 (H) 95.611.5 - 21.315.5 %   Platelets 183 150 - 400 K/uL  Type and screen [facility]   Collection Time: 10/24/16  1:10 AM  Result Value Ref Range   ABO/RH(D) O POS    Antibody Screen NEG    Sample Expiration 10/27/2016   Fern Test   Collection Time: 10/24/16  1:28 AM  Result Value Ref Range   POCT Fern Test Positive = ruptured amniotic membanes   Results for orders placed or performed during the hospital encounter of 10/23/16 (from the past 24 hour(s))  OB RESULT CONSOLE Group B Strep   Collection Time: 10/23/16  1:08 PM  Result Value Ref Range   GBS Negative   Results for orders placed or performed during the hospital encounter of 10/23/16 (from the past 24 hour(s))  Chlamydia/NGC rt PCR ([facility] only)   Collection Time: 10/23/16  8:02 AM  Result Value Ref Range   Specimen source GC/Chlam URINE, RANDOM    Chlamydia Tr NOT DETECTED NOT DETECTED   N gonorrhoeae NOT DETECTED NOT DETECTED    Patient Active Problem List   Diagnosis Date Noted  . Rupture of membranes with meconium present 10/24/2016  . Labor and delivery, indication for care 10/23/2016  . Asymptomatic bacteriuria 07/12/2016  . Late prenatal care 07/06/2016  . Supervision of normal pregnancy in third trimester 03/03/2015  . Genital herpes affecting pregnancy 03/03/2015    Assessment: [name] AmenDeysi Richards is a 20 y.o. G2P1001 at 8888w2d here for SROM  #labor: Patient will be admitted for progression of labor in the latent phase, SROM @ 1115 10/30 #Pain: Patient would like an epidural #FWB: Category 1 #ID:  GBS negative #MOF: Breast #MOC: Will discuss during later visit #Circ:   Will discuss at future visit  Josue D Santos 10/24/2016, 2:58 AM   OB FELLOW HISTORY AND PHYSICAL ATTESTATION  I have seen and examined this patient; I agree with above documentation in the resident's note.    Jen MowElizabeth Prabhleen Montemayor, DO OB Fellow 10/24/2016, 5:12 AM

## 2016-10-25 ENCOUNTER — Other Ambulatory Visit: Payer: Self-pay | Admitting: Obstetrics and Gynecology

## 2016-10-25 DIAGNOSIS — Z3A40 40 weeks gestation of pregnancy: Secondary | ICD-10-CM

## 2016-10-25 LAB — COMPREHENSIVE METABOLIC PANEL
ALT: 11 U/L — AB (ref 14–54)
AST: 20 U/L (ref 15–41)
Albumin: 2.4 g/dL — ABNORMAL LOW (ref 3.5–5.0)
Alkaline Phosphatase: 121 U/L (ref 38–126)
Anion gap: 7 (ref 5–15)
BILIRUBIN TOTAL: 0.4 mg/dL (ref 0.3–1.2)
BUN: 5 mg/dL — AB (ref 6–20)
CO2: 23 mmol/L (ref 22–32)
CREATININE: 0.6 mg/dL (ref 0.44–1.00)
Calcium: 8.2 mg/dL — ABNORMAL LOW (ref 8.9–10.3)
Chloride: 101 mmol/L (ref 101–111)
GFR calc Af Amer: >60 mL/min (ref >60)
Glucose, Bld: 116 mg/dL — ABNORMAL HIGH (ref 65–99)
Potassium: 3.4 mmol/L — ABNORMAL LOW (ref 3.5–5.1)
Sodium: 131 mmol/L — ABNORMAL LOW (ref 135–145)
TOTAL PROTEIN: 5.9 g/dL — AB (ref 6.5–8.1)

## 2016-10-25 LAB — CBC
HEMATOCRIT: 31 % — AB (ref 36.0–46.0)
Hemoglobin: 10.1 g/dL — ABNORMAL LOW (ref 12.0–15.0)
MCH: 28.3 pg (ref 26.0–34.0)
MCHC: 32.6 g/dL (ref 30.0–36.0)
MCV: 86.8 fL (ref 78.0–100.0)
Platelets: 132 10*3/uL — ABNORMAL LOW (ref 150–400)
RBC: 3.57 MIL/uL — AB (ref 3.87–5.11)
RDW: 17.1 % — ABNORMAL HIGH (ref 11.5–15.5)
WBC: 10 10*3/uL (ref 4.0–10.5)

## 2016-10-25 LAB — URINALYSIS W MICROSCOPIC (NOT AT ARMC)
Bilirubin Urine: NEGATIVE
GLUCOSE, UA: NEGATIVE mg/dL
Ketones, ur: NEGATIVE mg/dL
Leukocytes, UA: NEGATIVE
Nitrite: NEGATIVE
Protein, ur: NEGATIVE mg/dL
SPECIFIC GRAVITY, URINE: 1.015 (ref 1.005–1.030)
pH: 6 (ref 5.0–8.0)

## 2016-10-25 MED ORDER — OXYCODONE HCL 5 MG PO TABS
5.0000 mg | ORAL_TABLET | Freq: Once | ORAL | Status: AC
  Administered 2016-10-25: 5 mg via ORAL
  Filled 2016-10-25: qty 1

## 2016-10-25 MED ORDER — CYCLOBENZAPRINE HCL 10 MG PO TABS
10.0000 mg | ORAL_TABLET | Freq: Three times a day (TID) | ORAL | Status: DC
  Administered 2016-10-25: 10 mg via ORAL
  Filled 2016-10-25 (×2): qty 1

## 2016-10-25 MED ORDER — OXYCODONE HCL 5 MG PO TABS
5.0000 mg | ORAL_TABLET | Freq: Four times a day (QID) | ORAL | Status: DC | PRN
  Administered 2016-10-25 – 2016-10-26 (×2): 5 mg via ORAL
  Filled 2016-10-25 (×3): qty 1

## 2016-10-25 MED ORDER — CYCLOBENZAPRINE HCL 10 MG PO TABS: 10.0000 mg | ORAL_TABLET | Freq: Three times a day (TID) | ORAL | Status: DC

## 2016-10-25 MED ORDER — CYCLOBENZAPRINE HCL 10 MG PO TABS
10.0000 mg | ORAL_TABLET | Freq: Every day | ORAL | Status: DC
  Administered 2016-10-25 – 2016-10-27 (×3): 10 mg via ORAL
  Filled 2016-10-25 (×4): qty 1

## 2016-10-25 MED ORDER — DEXTROSE 5 % IV SOLN
2.0000 g | INTRAVENOUS | Status: DC
  Administered 2016-10-25: 2 g via INTRAVENOUS
  Filled 2016-10-25: qty 2

## 2016-10-25 NOTE — Addendum Note (Signed)
Addendum  created 10/25/16 0810 by Yolonda KidaAlison L Willson Lipa, CRNA   Charge Capture section accepted, Sign clinical note

## 2016-10-25 NOTE — Progress Notes (Signed)
Notified Dr. Evelene CroonSantos of pt c/o intense cramping pain despite motrin, tylenol, and heating pad. MD came to bedside to assess pt. Resident to ask attending if pt can have oxycodone. No new orders yet. Will continue to monitor.

## 2016-10-25 NOTE — Progress Notes (Signed)
Called to evaluate pt for ongoing pain. Nurinsg report rigors early and temp of 100.19F. She reports increasing lower abdominal pain. It is cramping in nature. Check CBC and CMP. Labs grossly wnl. No WBC count. Abdomen only mildly tender on exam. Fundus firm. Plan to add oxycodone and monitor overnight.

## 2016-10-25 NOTE — Anesthesia Postprocedure Evaluation (Signed)
Anesthesia Post Note  Patient: Kaisa Moccia  Procedure(s) Performed: * No procedures listed *  Patient location during evaluation: Mother Baby Anesthesia Type: Epidural Level of consciousness: awake, awake and alert, oriented and patient cooperative Pain management: pain level controlled Vital Signs Assessment: post-procedure vital signs reviewed and stable Respiratory status: spontaneous breathing, nonlabored ventilation and respiratory function stable Cardiovascular status: stable Postop Assessment: patient able to bend at knees, no headache, no backache and no signs of nausea or vomiting Anesthetic complications: no     Last Vitals:  Vitals:   10/24/16 2211 10/25/16 0603  BP: 104/62 113/65  Pulse: 99 100  Resp: 16 16  Temp: 36.7 C 36.8 C    Last Pain:  Vitals:   10/25/16 0735  TempSrc:   PainSc: 7    Pain Goal: Patients Stated Pain Goal: 3 (10/25/16 0735)               Nimai Burbach L

## 2016-10-25 NOTE — Progress Notes (Signed)
Spoke with Dr Genevie AnnSchenk about pt c/o pain of 8/10 in abdomen and pain of 7/10 in her back. Informed Dr Genevie AnnSchenk of pt shivering, checked VS and T-100.0, BP-140/85, HR-126. Per Dr Genevie AnnSchenk, order CBC and CMP, he will come to bedside to evaluate. Sherald BargeMatthews, Arris Meyn L

## 2016-10-25 NOTE — Progress Notes (Signed)
Notified OB resident of vital signs at 2100. MD assessed pt at bedside. No new orders yet. Will continue to monitor.   10/25/16 2058  Vital Signs  BP (!) 101/57  BP Location Right Arm  Patient Position (if appropriate) Semi-fowlers  BP Method Automatic  Pulse Rate (!) 138  Pulse Rate Source Dinamap  Resp 18  Temp (!) 101.5 F (38.6 C)  Temp Source Oral

## 2016-10-25 NOTE — Progress Notes (Signed)
Verbal order per Dr Wonda Oldsiccio to give another one time dose of 5mg  Oxycodone for pt pain scale 7/10. Sherald BargeMatthews, Verdis Bassette L

## 2016-10-25 NOTE — Progress Notes (Signed)
Notified Dr. Evelene CroonSantos that pt was very drowsy but still reporting 7/10 pain and that the flexeril had not relieved pain. Resident to consult with attending. Will continue to monitor.

## 2016-10-25 NOTE — Anesthesia Postprocedure Evaluation (Signed)
Anesthesia Post Note  Patient: [name] Richards  Procedure(s) Performed: * No procedures listed *  Vital Signs Assessment: post-procedure vital signs reviewed and stable     Last Vitals:  Vitals:   10/24/16 2211 10/25/16 0603  BP: 104/62 113/65  Pulse: 99 100  Resp: 16 16  Temp: 36.7 C 36.8 C    Last Pain:  Vitals:   10/25/16 0606  TempSrc:   PainSc: 7    Pain Goal:                 Cabe Lashley JENNETTE

## 2016-10-25 NOTE — Lactation Note (Signed)
This note was copied from a baby's chart. Lactation Consultation Note  Patient Name: [name] ZOXWR'UToday's Date: 10/25/2016  Follow up visit made.  Mom states baby is latching easily and feeding well.  Reviewed feeding on cue, using good waking techniques and breast massage during feeding.  Instructed to call out with concerns/assist.   Maternal Data    Feeding Feeding Type: Breast Fed Length of feed: 7 min  LATCH Score/Interventions                      Lactation Tools Discussed/Used     Consult Status      Huston FoleyMOULDEN, Jolane Bankhead S 10/25/2016, 2:35 PM

## 2016-10-25 NOTE — Progress Notes (Signed)
POSTPARTUM PROGRESS NOTE  Post Partum Day 01 Subjective:  [name] Richards is a 20 y.o. G2P1001 7877w3d s/p SVD @ 1417.  Patient with complaints of a 8/10 bilateral lower back pain that is worse with ambulation as well as bilateral lower abdominal pain. Patient was prescribed Flexeril which improved the pain to a 6-7/10 but made the patient really drowsy. Patient was prescribed a 1 time dose of 5mg  Roxycodone .  Pt states that ambulating worsens the pain. Patient report adequate PO intake, endorses voiding and flatus but denies having a bowel movement at this time.  She denies nausea or vomiting.  Pain is poorly controlled.  She has had flatus. She has not had bowel movement.  Lochia Small.   Objective: Blood pressure 113/65, pulse 100, temperature 98.2 F (36.8 C), temperature source Oral, resp. rate 16, height 5' 2.5" (1.588 m), weight 162 lb (73.5 kg), last menstrual period 12/23/2015, SpO2 98 %, not currently breastfeeding.  Physical Exam:  General: Alert and cooperative, in minor distress Lochia:normal flow Heart: RRR with intact distal pulses Abdomen: Tended to palpation in the lower left and right quadrants with L>R Uterine Fundus: firm DVT Evaluation: No calf swelling or tenderness MSK: TTP along the left lumbar Paraspinous muscles   Recent Labs  10/24/16 0110  HGB 10.4*  HCT 31.6*    Assessment/Plan:  ASSESSMENT: [name] Richards is a 20 y.o. G2P1001 6277w3d s/p SVD  Plan for discharge tomorrow, Breastfeeding, Lactation consult and Contraception Mirena  - Encouraged patient to ambulate - Will reevaluate pain after am roxycodone   LOS: 1 day   Deniece ReeJosue D Santos, MD 10/25/2016, 7:31 AM    OB FELLOW POSTPARTUM PROGRESS NOTE ATTESTATION  I have seen and examined this patient and agree with above documentation in the resident's note.   Ernestina PennaNicholas Schenk, MD 1:42 PM

## 2016-10-25 NOTE — Clinical Social Work Maternal (Signed)
  CLINICAL SOCIAL WORK MATERNAL/CHILD NOTE  Patient Details  Name: [name] Richards MRN: [id number] Date of Birth: 09/24/1996  Date:  10/25/2016  Clinical Social Worker Initiating Note:  Blaine HamperAngel Boyd-Gilyard Date/ Time Initiated:  10/25/16/1043     Child's Name:      Legal Guardian:  Mother   Need for Interpreter:  None   Date of Referral:  10/25/16     Reason for Referral:  Behavioral Health Issues, including SI    Referral Source:  Central Nursery   Address:  [address] [place] [address]  Phone number:  [phone]   Household Members:  Self, Significant Other, Relatives   Natural Supports (not living in the home):  Extended Family, Immediate Family, Spouse/significant other, Parent   Professional Supports: Therapist   Employment: Unemployed   Type of Work:     Education:  9 to 11 years   Architectinancial Resources:   (Medicaid is pending.)   Other Resources:  [facility]   Cultural/Religious Considerations Which May Impact Care:  None Reported  Strengths:  Merchandiser, retailediatrician chosen , Home prepared for child    Risk Factors/Current Problems:  Mental Health Concerns    Cognitive State:  Linear Thinking    Mood/Affect:  Flat , Comfortable , Calm  (tired)   CSW Assessment:CSW meet with MOB to complete an assessment for mental health concerns.  When CSW arrived, MOB was resting in the bed and infant was asleep in the bassinet.  MOB was inviting and polite. CSW inquired about MIOB's MH and MOB acknowledged being depressed and sad throughout MOB's pregnancy.  CSW attempted to process with MOB when MOB's feelings of sadness initiated and MOB was unable to say specifically. MOB also demonstrated a challenge with verbalizing what depressed looked and felt like to MOB. CSW educated MOB and about PPD. CSW informed MOB of possible supports and interventions to decrease PPD.  CSW also encouraged MOB to seek medical attention if needed for increased signs and symptoms of PPD.  MOB denied  having any PPD symptoms with MOB's oldest child. CSW also offered MOB resources for outpatient behavioral health services and in-home parenting programs; MOB declined. CSW reviewed safe sleep and SIDS. MOB and was knowledgeable and asked appropriate questions. CSW Plan/Description:  Patient/Family Education , No Further Intervention Required/No Barriers to Discharge, Information/Referral to [employer]    Aidon Klemens Boyd-Gilyard, MSW, CDW CorporationLCSW Clinical Social Work [phone]    Barbara CowerNGEL D BOYD-GILYARD, LCSW 10/25/2016, 10:46 AM

## 2016-10-25 NOTE — Progress Notes (Signed)
Notified Dr. Evelene CroonSantos of UA results. Pt having chills. Antibiotics given. No new orders.

## 2016-10-26 LAB — CBC
HEMATOCRIT: 28.8 % — AB (ref 36.0–46.0)
Hemoglobin: 9.4 g/dL — ABNORMAL LOW (ref 12.0–15.0)
MCH: 27.8 pg (ref 26.0–34.0)
MCHC: 32.6 g/dL (ref 30.0–36.0)
MCV: 85.2 fL (ref 78.0–100.0)
Platelets: 130 10*3/uL — ABNORMAL LOW (ref 150–400)
RBC: 3.38 MIL/uL — ABNORMAL LOW (ref 3.87–5.11)
RDW: 17.2 % — AB (ref 11.5–15.5)
WBC: 11.2 10*3/uL — AB (ref 4.0–10.5)

## 2016-10-26 LAB — LACTIC ACID, PLASMA
LACTIC ACID, VENOUS: 1.2 mmol/L (ref 0.5–1.9)
Lactic Acid, Venous: 1.4 mmol/L (ref 0.5–1.9)
Lactic Acid, Venous: 1.3 mmol/L (ref 0.5–1.9)

## 2016-10-26 LAB — INFLUENZA PANEL BY PCR (TYPE A & B)
Influenza A By PCR: NEGATIVE
Influenza B By PCR: NEGATIVE

## 2016-10-26 MED ORDER — SODIUM CHLORIDE 0.9 % IV BOLUS (SEPSIS)
1000.0000 mL | Freq: Once | INTRAVENOUS | Status: AC
Start: 2016-10-26 — End: 2016-10-26
  Administered 2016-10-26: 1000 mL via INTRAVENOUS

## 2016-10-26 MED ORDER — SODIUM CHLORIDE 0.9 % IV BOLUS (SEPSIS)
2000.0000 mL | Freq: Once | INTRAVENOUS | Status: AC
Start: 2016-10-26 — End: 2016-10-26
  Administered 2016-10-26: 2000 mL via INTRAVENOUS

## 2016-10-26 MED ORDER — VANCOMYCIN HCL IN DEXTROSE 1-5 GM/200ML-% IV SOLN
1000.0000 mg | Freq: Three times a day (TID) | INTRAVENOUS | Status: DC
  Administered 2016-10-26 – 2016-10-28 (×5): 1000 mg via INTRAVENOUS
  Filled 2016-10-26 (×7): qty 200

## 2016-10-26 MED ORDER — PIPERACILLIN-TAZOBACTAM 3.375 G IVPB
3.3750 g | Freq: Three times a day (TID) | INTRAVENOUS | Status: DC
  Administered 2016-10-26 – 2016-10-28 (×7): 3.375 g via INTRAVENOUS
  Filled 2016-10-26 (×9): qty 50

## 2016-10-26 MED ORDER — OXYCODONE HCL 5 MG PO TABS
5.0000 mg | ORAL_TABLET | ORAL | Status: DC | PRN
  Administered 2016-10-26 – 2016-10-28 (×7): 5 mg via ORAL
  Filled 2016-10-26 (×8): qty 1

## 2016-10-26 MED ORDER — PIPERACILLIN-TAZOBACTAM 3.375 G IVPB: 3.3750 g | INTRAVENOUS | Status: DC

## 2016-10-26 MED ORDER — SODIUM CHLORIDE 0.9 % IV SOLN
INTRAVENOUS | Status: DC
  Administered 2016-10-26 – 2016-10-28 (×6): via INTRAVENOUS

## 2016-10-26 MED ORDER — OXYCODONE HCL 5 MG PO TABS
5.0000 mg | ORAL_TABLET | Freq: Once | ORAL | Status: AC
  Administered 2016-10-26: 5 mg via ORAL
  Filled 2016-10-26: qty 1

## 2016-10-26 NOTE — Progress Notes (Signed)
Dr. Genevie AnnSchenk notified of pt Temperature, chills no releif from pain medicine.  See new orders.

## 2016-10-26 NOTE — Lactation Note (Signed)
This note was copied from a baby's chart. Lactation Consultation Note  Patient Name: [name] Richards Weidmann RUEAV'WToday's Date: 10/26/2016 Reason for consult: Follow-up assessment  Follow up with mom of 47 hour old infant. [facility]. Mother shivering uncontrollably, Luisa Dagoanya Corbitt RN was notified. She is not currently BF as she her nipples are cracked and hurting per RN. Mom has comfort gels and coconut oil. She is planning to pump at this time. She last pumped this morning. Offered assistance and she asked me to set up DEBP. Enc her to pump every 2-3 hours. Mom voiced understanding. Will follow up tomorrow and prn.   Maternal Data    Feeding    LATCH Score/Interventions                      Lactation Tools Discussed/Used Pump Review: Setup, frequency, and cleaning   Consult Status Consult Status: Follow-up Date: 10/27/16 Follow-up type: In-patient    Silas FloodSharon S Hice 10/26/2016, 2:04 PM

## 2016-10-26 NOTE — Progress Notes (Signed)
Notified Dr. Despina HiddenEure of fever of 103.2, chills, antibiotics and tylenol administered. MD ordered another dose of tylenol 650 mg given now, 2L bolus of NS, then NS @ 125 ml/hr after bolus.

## 2016-10-26 NOTE — Progress Notes (Signed)
   10/26/16 1100  Clinical Encounter Type  Visited With Patient not available;Health care provider  Visit Type Initial  Referral From Care management  Consult/Referral To Chaplain  Stress Factors  Patient Stress Factors None identified  Family Stress Factors None identified  Advance Directives (For Healthcare)  Does patient have an advance directive? No  Would patient like information on creating an advanced directive? Yes - Spiritual care consult ordered    Chaplain checked in with the Pt's Nurse before going into the Pt's room. Nurse asked chaplain to come back later (maybe afternoon or evening) due to the fact that the Pt was not feeling good. Chaplain is more than happy to stop by later to give Pt paperwork relating to advance directives and/or provided any emotional support needed.   Tanja PortBeatrice M Blakeley Scheier, Chaplain

## 2016-10-26 NOTE — Progress Notes (Signed)
Pharmacy Antibiotic Note  [name] Richards is a 20 y.o. female admitted on 10/23/2016 and s/o SVD on 10/31.  Pharmacy has been consulted for Vancomycin dosing due to persistent chills, temp and tenderness.  Plan: Vancomycin 1 gram IV Q8h. Will plan to check trough prior to 4th dose.  Height: 5' 2.5" (158.8 cm) Weight: 162 lb (73.5 kg) IBW/kg (Calculated) : 51.25  Temp (24hrs), Avg:99.7 F (37.6 C), Min:97.8 F (36.6 C), Max:103.2 F (39.6 C)   Recent Labs Lab 10/24/16 0110 10/25/16 1843 10/26/16 0815 10/26/16 1118 10/26/16 2042  WBC 13.3* 10.0  --   --  11.2*  CREATININE  --  0.60  --   --   --   LATICACIDVEN  --   --  1.4 1.2  --     Estimated Creatinine Clearance: 105.7 mL/min (by C-G formula based on SCr of 0.6 mg/dL).    No Known Allergies  Antimicrobials this admission: Rocephin 2 gram IV q24h  11/1 >> 11/2 Zosyn 3.375g IV q8h  11/2 >>   Dose adjustments this admission:   Microbiology results: 11/2   BCx:            BCx: 11/1 Urine: negative   Thank you for allowing pharmacy to be a part of this patient's care.  Claybon Jabsngel, Jayel Scaduto G 10/26/2016 8:57 PM

## 2016-10-26 NOTE — Plan of Care (Signed)
Problem: Nutritional: Goal: Mothers verbalization of comfort with breastfeeding process will improve Outcome: Progressing Pt c/o cracked, bleeding and sore nipples.  Pt given comfort gel and coconut oil.  Encouraged to hand express, pt unable to due to soreness.  Pt requests bottle feeding for baby, encouraged to pump for 15 minutes after feeding baby.

## 2016-10-26 NOTE — Progress Notes (Signed)
POSTPARTUM PROGRESS NOTE  Post Partum Day  #2 Subjective:  [name] Richards is a 20 y.o. G2P1001 279w4d s/p SVD.  Overnight the patient was febrile to 103F with CVA tenderness. Ceftriaxone was started and ordered UA was negative for bactiuria/UTI.  Pt denies problems with ambulating, voiding or po intake.  She denies nausea or vomiting.  Pain is moderately controlled.  She has had flatus. She has had bowel movement.  Lochia Moderate.  Patient describes 5/10 lower abdominal and lower back pain. Patient endorses chills  Objective: Blood pressure (!) 99/52, pulse (!) 112, temperature (!) 102.5 F (39.2 C), resp. rate 18, height 5' 2.5" (1.588 m), weight 162 lb (73.5 kg), last menstrual period 12/23/2015, SpO2 98 %, not currently breastfeeding.  Physical Exam:  General: alert, cooperative and no distress Lochia:normal flow Chest: CTAB Heart: RRR no m/r/g Abdomen: Abdomen is diffusely tender to palpation, Bilateral CVA tenderness with R>>L Uterine Fundus: firm DVT Evaluation: No calf swelling or tenderness    Recent Labs  10/24/16 0110 10/25/16 1843  HGB 10.4* 10.1*  HCT 31.6* 31.0*    Assessment/Plan:  ASSESSMENT: [name] Richards is a 20 y.o. G2P1001 779w4d s/p SVD with overnight fevers and chills  Discontinue Ceftriaxone and begin IV Zosyn Q4H Continue Tamiflu Pending blood cultures Pending Lactic acid Pending Fluswab  If lactatic acid is elevated or patient is febrile again will add Vancomycin   LOS: 2 days   Deniece ReeJosue D Santos, MD 10/26/2016, 7:54 AM

## 2016-10-27 LAB — LACTIC ACID, PLASMA: Lactic Acid, Venous: 1.8 mmol/L (ref 0.5–1.9)

## 2016-10-27 NOTE — Progress Notes (Signed)
POSTPARTUM PROGRESS NOTE  Post Partum Day  #3 Subjective:  [name] Richards is a 20 y.o. G2P1001 6367w4d s/p SVD.  Overnight the patient was febrile to Tmax for past 24 hours was 102.5 yesterday AM, and she was febril again in the PM to 100.5. She reports she feels significantly better today. She reports her chills and energy are improving. She also reports decreased pain.   She denies nausea or vomiting.  Pain is moderately controlled.  She has had flatus. She has had bowel movement.  Lochia Moderate.  Patient describes 5/10 lower abdominal and lower back pain. Patient endorses chills  Objective: Blood pressure 105/71, pulse 91, temperature 97.7 F (36.5 C), temperature source Oral, resp. rate 18, height 5' 2.5" (1.588 m), weight 162 lb (73.5 kg), last menstrual period 12/23/2015, SpO2 100 %, not currently breastfeeding.  Physical Exam:  General: alert, cooperative and no distress Lochia:normal flow Chest: CTAB Heart: RRR no m/r/g Abdomen: Abdomen in appropriately tender this AM with no rebound or guarding.  Uterine Fundus: firm DVT Evaluation: No calf swelling or tenderness    Recent Labs  10/25/16 1843 10/26/16 2042  HGB 10.1* 9.4*  HCT 31.0* 28.8*    Assessment/Plan:  ASSESSMENT: [name] Richards is a 20 y.o. G2P1001 8167w4d s/p SVD with fevers in last 24 hours. Patient no on sepsis protocol Patient has not been on zosyn for 24 hours and vancomycin since last yesterday afternoon. Continue Tamiflu Pending blood cultures Pending lactatic acid has been normal x4 Flu negative Will plan to continue antibiotics for 24 more hours at this time. If patient remains afebrile could consider d/c tomorrow AM.   LOS: 3 days   Ernestina PennaNicholas Schenk, MD 10/27/2016, 7:33 AM

## 2016-10-27 NOTE — Lactation Note (Addendum)
This note was copied from a baby's chart. Lactation Consultation Note  Patient Name: Girl Dolan AmenDeysi Fanguy ZOXWR'UToday's Date: 10/27/2016 Reason for consult: Follow-up assessment  Baby 74 hours old. Mom is using DEBP and pumping left breast only. Mom reports that she cannot pump both breasts simultaneously at this time d/t IV insertion in right arm. Offered to assist mom with latching baby, but mom declined d/t sore nipples. Mom reports that she nursed her first child without any issues. Baby asleep in crib while mom pumping. Enc mom to put baby to breast first with cues and then supplement with EBM/formula as needed. Mom states that she has been giving primarily formula because she has been very tired--mom was asleep 2 separate times during the day when this LC attempted to assist with latching. Enc mom to call for assistance with latching as needed. Discussed progression of milk coming to volume and supply and demand. Discussed WIC loaner pump with mom as she reports that she is active with WIC, but has not been in contact with them since the baby was born. Mom expects to be discharged tomorrow--Saturday, 10-28-16, and is aware of $30 cash requirement for loaner.   Maternal Data Does the patient have breastfeeding experience prior to this delivery?: Yes  Feeding Feeding Type: Bottle Fed - Formula  LATCH Score/Interventions                      Lactation Tools Discussed/Used     Consult Status Consult Status: Follow-up Date: 10/28/16 Follow-up type: In-patient    Sherlyn HayJennifer D Nicklas Mcsweeney 10/27/2016, 4:39 PM

## 2016-10-28 DIAGNOSIS — O864 Pyrexia of unknown origin following delivery: Secondary | ICD-10-CM | POA: Diagnosis not present

## 2016-10-28 DIAGNOSIS — O8620 Urinary tract infection following delivery, unspecified: Secondary | ICD-10-CM | POA: Diagnosis not present

## 2016-10-28 MED ORDER — IBUPROFEN 600 MG PO TABS: 600.0000 mg | ORAL_TABLET | Freq: Four times a day (QID) | ORAL | 0 refills | Status: DC

## 2016-10-28 MED ORDER — AMOXICILLIN-POT CLAVULANATE 500-125 MG PO TABS: 1.0000 | ORAL_TABLET | Freq: Three times a day (TID) | ORAL | 0 refills | Status: DC

## 2016-10-28 NOTE — Discharge Summary (Signed)
OB Discharge Summary     Patient Name: [name] Richards DOB: 05-16-1996 MRN: [id number]  Date of admission: 10/23/2016 Delivering MD: [name] Richards   Date of discharge: 10/28/2016  Admitting diagnosis: 40WKS CTX 3 MINS Intrauterine pregnancy: 5788w6d     Secondary diagnosis:  Active Problems:   Rupture of membranes with meconium present   Postpartum UTI (urinary tract infection) postpartum fever Additional problems:      Discharge diagnosis: Term Pregnancy Delivered and postpartum fever                                 Postpartum uti                                                                Post partum procedures:IV antibiotics x 72 hr  Augmentation: Pitocin  Complications: None  Hospital course:  Onset of Labor With Vaginal Delivery     20 y.o. yo G2P1001 at 5888w6d was admitted in Latent Labor on 10/23/2016. Patient had an uncomplicated labor course as follows:  Membrane Rupture Time/Date: 11:00 PM ,10/23/2016   Intrapartum Procedures: Episiotomy: None [1]                                         Lacerations:  None [1]  Patient had a delivery of a Viable infant. Delivery Note Anterior lip moved back and head emerged in just a few pushes. At 2:17 PM a viable and healthy female was delivered via  (Presentation: OA ).  APGAR: , ; weight  .   Placenta status: Spontaneous and grossly intact with 3VC, with the following complications: none  Anesthesia:  epidural Episiotomy:  none Lacerations:  None Suture Repair: none Est. Blood Loss (mL):  75  Mom to postpartum.  Baby to Couplet care / Skin to Skin.  [name] Richards 10/24/2016, 2:31 PM  10/24/2016  Information for the patient's newborn:  [name] Richards [[id number]][[id number]]  Delivery Method: Vaginal, Spontaneous Delivery (Filed from Delivery Summary)    Pateint had a complicated postpartum course. She had a temperature to 103 on postpartum day 1, was placed on Rocephin. Urine culture grew 10 to the fifth gram-negative  rod. Final culture diagnosis pending at time of discharge Blood cultures were negative A review of records shows that she had a history of Proteus UTI during the pregnancy  she remained afebrile for the final 48 hours of her hospitalization with tolerance of breast-feeding and is ambulating negative for any other signs of infection or complication    She is ambulating, tolerating a regular diet, passing flatus, and urinating well. Patient is discharged home in stable condition on 10/28/16.    Physical exam Vitals:   10/27/16 2147 10/28/16 0201 10/28/16 0328 10/28/16 0548  BP: 113/79 129/84  107/69  Pulse: (!) 115 (!) 103  79  Resp: 18 16  18   Temp: 98.4 F (36.9 C) 100.3 F (37.9 C) 98.5 F (36.9 C) 98.3 F (36.8 C)  TempSrc: Oral Oral Oral Oral  SpO2: 99% 98%  97%  Weight:      Height:  General: alert, cooperative and no distress Lochia: appropriate Uterine Fundus: firm Incision:  DVT Evaluation: No evidence of DVT seen on physical exam. Labs: Lab Results  Component Value Date   WBC 11.2 (H) 10/26/2016   HGB 9.4 (Richards) 10/26/2016   HCT 28.8 (Richards) 10/26/2016   MCV 85.2 10/26/2016   PLT 130 (Richards) 10/26/2016   CMP Latest Ref Rng & Units 10/25/2016  Glucose 65 - 99 mg/dL 161(W116(H)  BUN 6 - 20 mg/dL 5(Richards)  Creatinine 9.600.44 - 1.00 mg/dL 4.540.60  Sodium 098135 - 119145 mmol/Richards 131(Richards)  Potassium 3.5 - 5.1 mmol/Richards 3.4(Richards)  Chloride 101 - 111 mmol/Richards 101  CO2 22 - 32 mmol/Richards 23  Calcium 8.9 - 10.3 mg/dL 8.2(Richards)  Total Protein 6.5 - 8.1 g/dL 5.9(Richards)  Total Bilirubin 0.3 - 1.2 mg/dL 0.4  Alkaline Phos 38 - 126 U/Richards 121  AST 15 - 41 U/Richards 20  ALT 14 - 54 U/Richards 11(Richards)    Discharge instruction: per After Visit Summary and "Baby and Me Booklet".  After visit meds:    Medication List    STOP taking these medications   acyclovir 400 MG tablet Commonly known as:  ZOVIRAX     TAKE these medications   amoxicillin-clavulanate 500-125 MG tablet Commonly known as:  AUGMENTIN Take 1 tablet (500 mg total)  by mouth 3 (three) times daily.   ibuprofen 600 MG tablet Commonly known as:  ADVIL,MOTRIN Take 1 tablet (600 mg total) by mouth every 6 (six) hours.   Prenatal Vitamins 0.8 MG tablet Take 1 tablet by mouth daily.       Diet: routine diet  Activity: Advance as tolerated. Pelvic rest for 6 weeks.   Outpatient follow up:6 weeks Follow up Appt:Future Appointments Date Time Provider Department Center  11/29/2016 1:20 PM [name] LowensteinJulie N Wenzel, PA-C [facility]   Follow up Visit:No Follow-up on file.  Postpartum contraception: Not Discussed  Newborn Data: Live born female  Birth Weight: 8 lb 3.6 oz (3731 g) APGAR: 8, 9  Baby Feeding: Breast Disposition:home with mother   10/28/2016 Tilda BurrowFERGUSON,[name] Wassenaar V, MD

## 2016-10-28 NOTE — Discharge Instructions (Signed)

## 2016-10-28 NOTE — Progress Notes (Signed)
Discharge instructions reviewed with patient.  Patient states understanding of home care for self and baby, medications, activity, signs/symptoms to report to MD and return MD office visit for both.  Patients significant other and family will assist with her care @ home.  No home  equipment needed, patient has prescriptions and all personal belongings.  Patient ambulated for discharge in stable condition with staff without incident.  Baby discharged to care of mother.

## 2016-10-28 NOTE — Lactation Note (Signed)
This note was copied from a baby's chart. Lactation Consultation Note  Patient Name: [name] COIOD'G Date: 10/28/2016 Reason for consult: Follow-up assessment  Baby 95 hours old. Mom reports that she does not want a [facility] loaner, but intends to go to [facility] on Monday morning. Mom given a manual pump and reports that she used with her first child. Mom also shown how to use piston in her pumping kit to manually pump both breasts simultaneously. Mom aware of OP/BFSG and [place] phone line assistance after D/C.   Maternal Data Has patient been taught Hand Expression?: Yes (Per mom.)  Feeding    LATCH Score/Interventions                      Lactation Tools Discussed/Used     Consult Status Consult Status: Complete    Andres Labrum 10/28/2016, 1:31 PM

## 2016-10-29 ENCOUNTER — Inpatient Hospital Stay (HOSPITAL_COMMUNITY): Admission: RE | Admit: 2016-10-29 | Payer: Medicaid Other | Source: Ambulatory Visit

## 2016-10-29 LAB — URINE CULTURE: SPECIAL REQUESTS: NORMAL

## 2016-10-31 LAB — CULTURE, BLOOD (ROUTINE X 2)
CULTURE: NO GROWTH
CULTURE: NO GROWTH
Special Requests: 8

## 2016-11-29 ENCOUNTER — Ambulatory Visit: Payer: Self-pay | Admitting: Medical

## 2016-12-20 ENCOUNTER — Encounter (HOSPITAL_COMMUNITY): Payer: Self-pay
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
## 2017-04-03 IMAGING — US US MFM OB COMP +14 WKS
1 series · 14 of 28 positions shown · non-contrast
Comparison: none

[Series 1: us mfm ob comp +14 wks · 74 acquisitions, 14 frames shown]
[im 3/74]
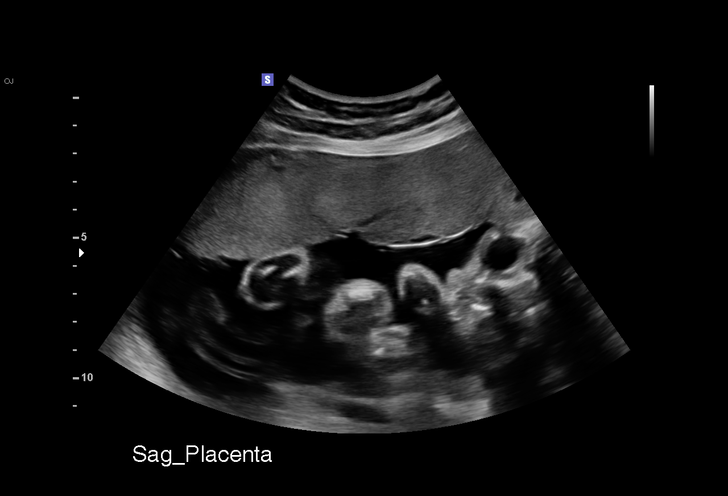
[im 9/74]
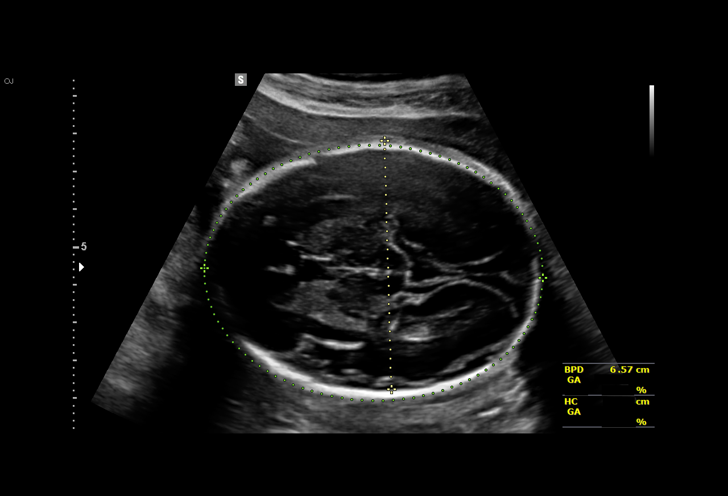
[im 14/74]
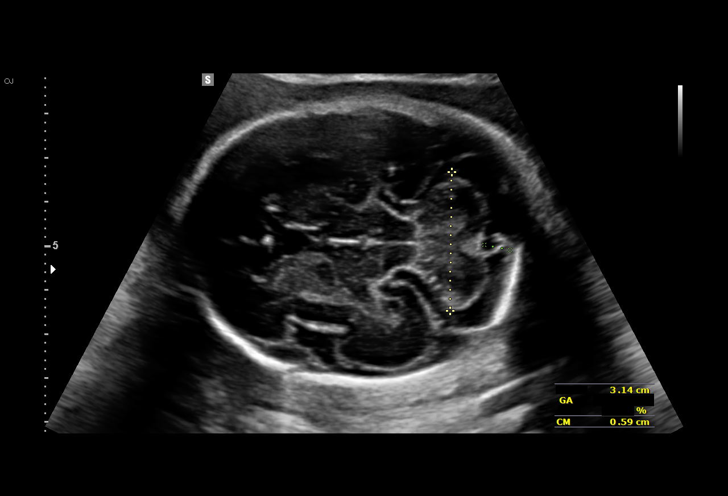
[im 19/74]
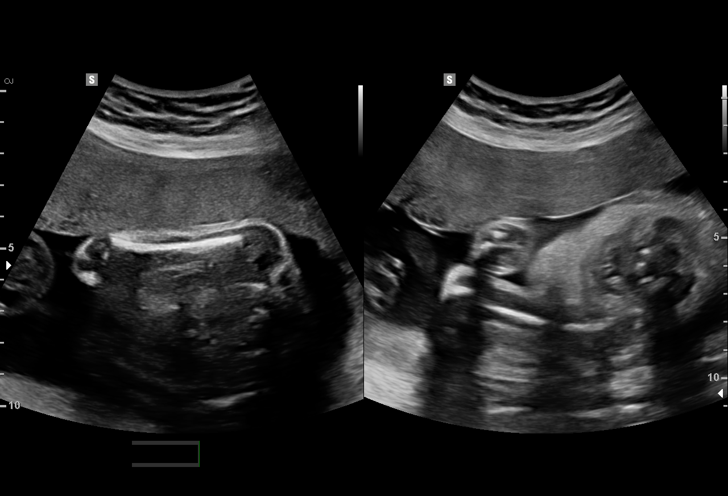
[im 25/74]
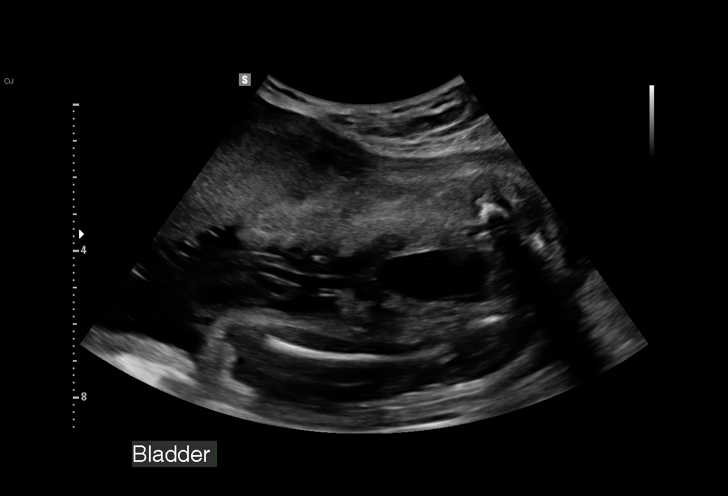
[im 30/74]
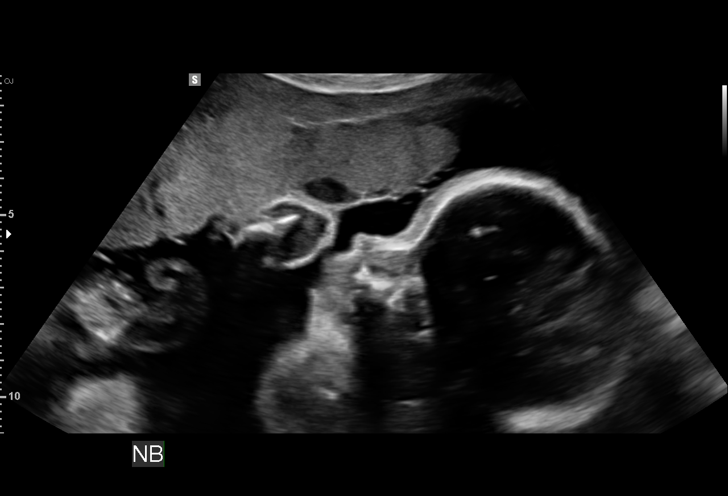
[im 36/74]
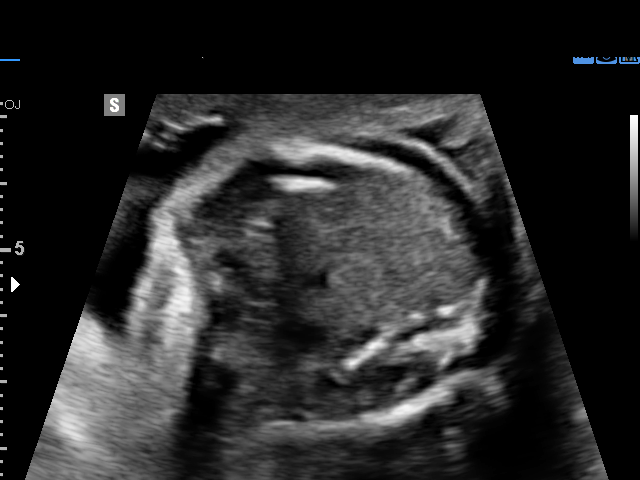
[im 41/74]
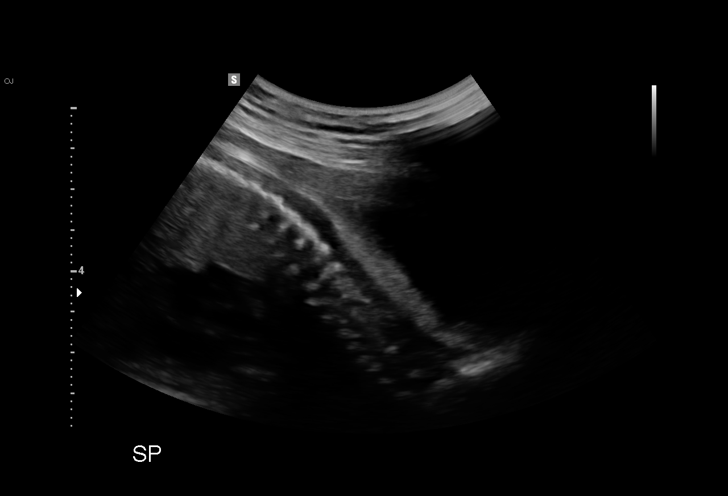
[im 46/74]
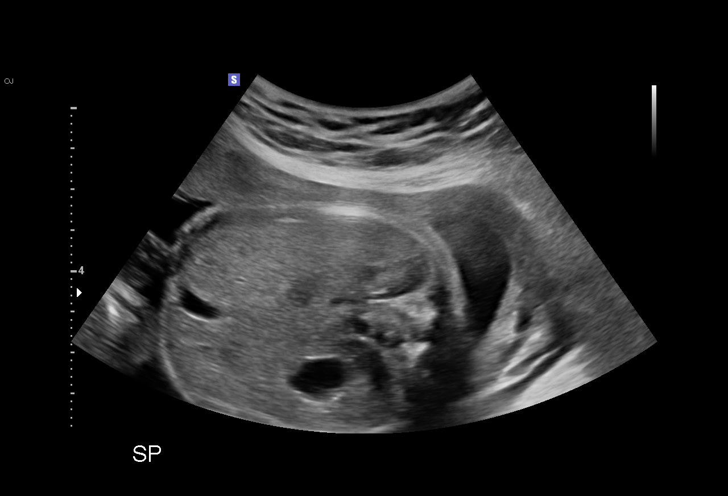
[im 52/74]
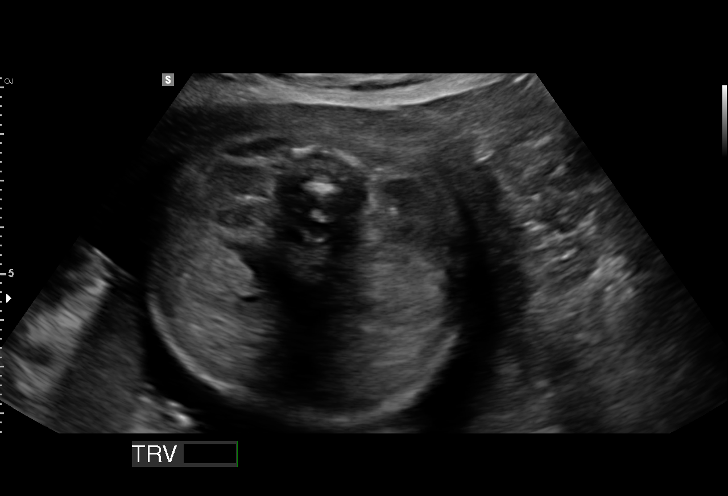
[im 57/74]
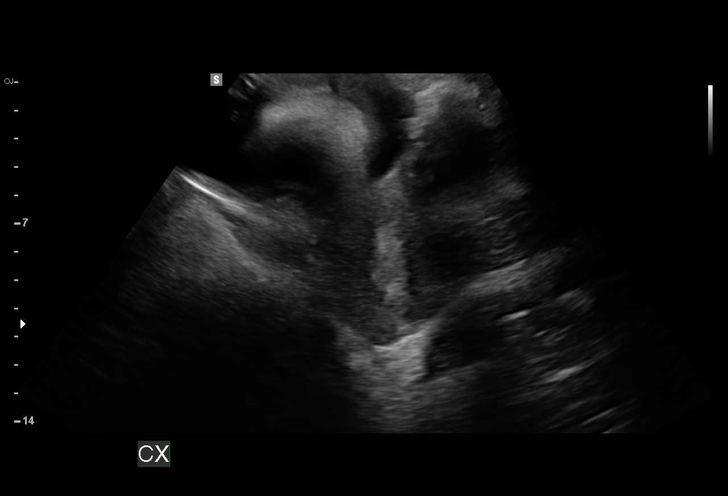
[im 63/74]
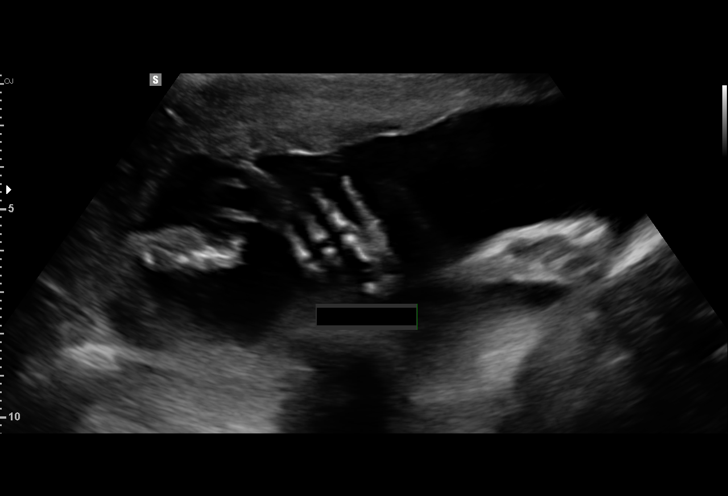
[im 68/74]
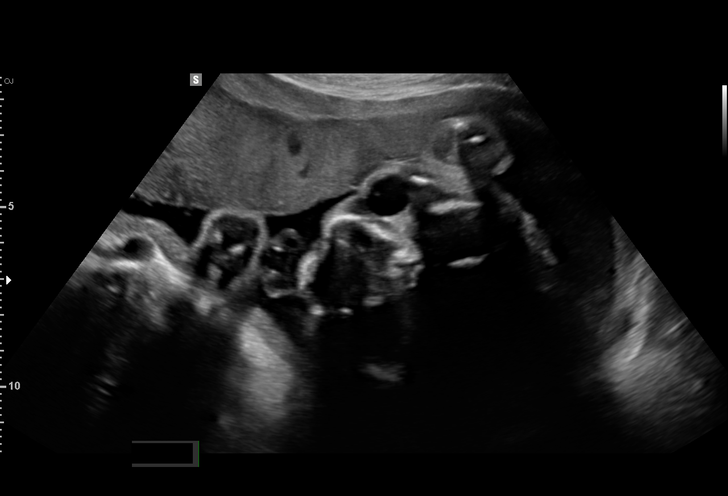
[im 74/74]
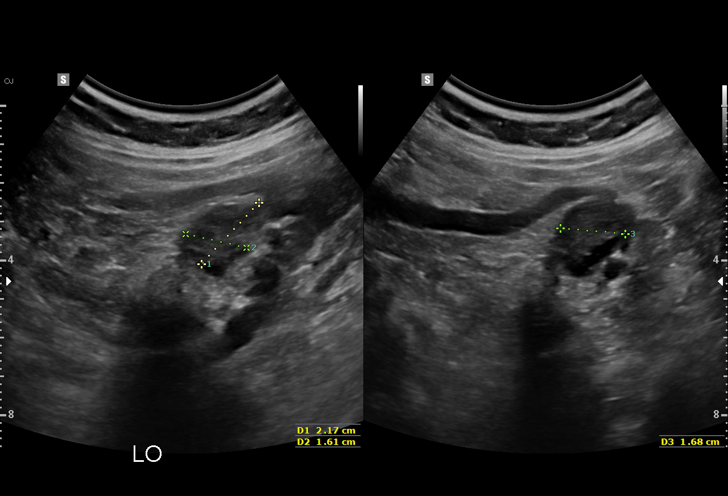

[14 of 28 positions shown; findings below may reference images not displayed]

1  LKW TIGER          [id number]      [id number]     [id number]
Indications

26 weeks gestation of pregnancy
Late prenatal care, second trimester
Short interval between pregancies, 2nd
trimester
OB History

Gravidity:    2         Term:   1
Living:       1
Fetal Evaluation

Num Of Fetuses:     1
Fetal Heart         161
Rate(bpm):
Cardiac Activity:   Observed
Presentation:       Cephalic
Placenta:           Anterior, above cervical os
P. Cord Insertion:  Marginal insertion

Amniotic Fluid
AFI FV:      Subjectively within normal limits

Largest Pocket(cm)
5.6
Biometry

BPD:      65.6  mm     G. Age:  26w 3d         26  %    CI:        72.88   %   70 - 86
FL/HC:      20.5   %   18.6 -
HC:      244.3  mm     G. Age:  26w 4d         16  %    HC/AC:      1.07       1.05 -
AC:      229.3  mm     G. Age:  27w 2d         55  %    FL/BPD:     76.2   %   71 - 87
FL:         50  mm     G. Age:  26w 6d         37  %    FL/AC:      21.8   %   20 - 24
HUM:      44.5  mm     G. Age:  26w 3d         36  %
CER:      31.4  mm     G. Age:  27w 3d         60  %

CM:        5.9  mm
Est. FW:    9499  gm      2 lb 4 oz     57  %
Gestational Age

LMP:           29w 6d       Date:   12/23/15                 EDD:   09/28/16
U/S Today:     26w 6d                                        EDD:   10/19/16
Best:          26w 6d    Det. By:   U/S (07/19/16)           EDD:   10/19/16
Anatomy

Cranium:               Appears normal         Aortic Arch:            Appears normal
Cavum:                 Appears normal         Ductal Arch:            Appears normal
Ventricles:            Appears normal         Diaphragm:              Appears normal
Choroid Plexus:        Appears normal         Stomach:                Appears normal, left
sided
Cerebellum:            Appears normal         Abdomen:                Appears normal
Posterior Fossa:       Appears normal         Abdominal Wall:         Appears nml (cord
insert, abd wall)
Nuchal Fold:           Not applicable (>20    Cord Vessels:           Appears normal (3
wks GA)                                        vessel cord)
Face:                  Appears normal         Kidneys:                Appear normal
(orbits and profile)
Lips:                  Appears normal         Bladder:                Appears normal
Thoracic:              Appears normal         Spine:                  Appears normal
Heart:                 Appears normal         Upper Extremities:      Appears normal
(4CH, axis, and situs
RVOT:                  Appears normal         Lower Extremities:      Appears normal
LVOT:                  Appears normal

Other:  Fetus appears to be a female. Heels and 5th digit visualized.
Cervix Uterus Adnexa

Cervix
Length:            4.3  cm.
Normal appearance by transabdominal scan.

Uterus
No abnormality visualized.

Left Ovary
Within normal limits.

Right Ovary
Within normal limits.
Impression

Single IUP at 26w 6d
Normal fetal anatomic survey
The estimated fetal weight is at the 57th %tile.
Anterior placenta without previa
Normal amniotic fluid volume
Recommendations
Follow-up ultrasounds as clinically indicated.

## 2017-07-22 ENCOUNTER — Emergency Department
Admission: EM | Admit: 2017-07-22 | Discharge: 2017-07-22 | Disposition: A | Discharge status: Home / Self Care | Payer: Medicaid Other | Attending: Emergency Medicine | Admitting: Emergency Medicine

## 2017-07-22 DIAGNOSIS — R780 Finding of alcohol in blood: Secondary | ICD-10-CM

## 2017-07-22 DIAGNOSIS — F1910 Other psychoactive substance abuse, uncomplicated: Secondary | ICD-10-CM | POA: Insufficient documentation

## 2017-07-22 DIAGNOSIS — R4189 Other symptoms and signs involving cognitive functions and awareness: Secondary | ICD-10-CM | POA: Insufficient documentation

## 2017-07-22 DIAGNOSIS — F10129 Alcohol abuse with intoxication, unspecified: Secondary | ICD-10-CM

## 2017-07-22 LAB — URINE DRUG SCREEN, QUALITATIVE (ARMC ONLY)
Amphetamines, Ur Screen: POSITIVE — AB
BARBITURATES, UR SCREEN: NOT DETECTED
BENZODIAZEPINE, UR SCRN: NOT DETECTED
CANNABINOID 50 NG, UR ~~LOC~~: POSITIVE — AB
COCAINE METABOLITE, UR ~~LOC~~: POSITIVE — AB
MDMA (Ecstasy)Ur Screen: NOT DETECTED
METHADONE SCREEN, URINE: NOT DETECTED
OPIATE, UR SCREEN: NOT DETECTED
PHENCYCLIDINE (PCP) UR S: NOT DETECTED
Tricyclic, Ur Screen: NOT DETECTED

## 2017-07-22 LAB — CBC
HEMATOCRIT: 42.4 % (ref 35.0–47.0)
HEMOGLOBIN: 14.4 g/dL (ref 12.0–16.0)
MCH: 31.2 pg (ref 26.0–34.0)
MCHC: 34 g/dL (ref 32.0–36.0)
MCV: 91.8 fL (ref 80.0–100.0)
Platelets: 228 10*3/uL (ref 150–440)
RBC: 4.62 MIL/uL (ref 3.80–5.20)
RDW: 13 % (ref 11.5–14.5)
WBC: 10.1 10*3/uL (ref 3.6–11.0)

## 2017-07-22 LAB — COMPREHENSIVE METABOLIC PANEL
ALK PHOS: 90 U/L (ref 38–126)
ALT: 16 U/L (ref 14–54)
AST: 25 U/L (ref 15–41)
Albumin: 4.9 g/dL (ref 3.5–5.0)
Anion gap: 10 (ref 5–15)
BILIRUBIN TOTAL: 0.5 mg/dL (ref 0.3–1.2)
BUN: 10 mg/dL (ref 6–20)
CALCIUM: 9.2 mg/dL (ref 8.9–10.3)
CO2: 22 mmol/L (ref 22–32)
CREATININE: 0.55 mg/dL (ref 0.44–1.00)
Chloride: 108 mmol/L (ref 101–111)
GFR calc Af Amer: >60 mL/min (ref >60)
Glucose, Bld: 138 mg/dL — ABNORMAL HIGH (ref 65–99)
POTASSIUM: 3.2 mmol/L — AB (ref 3.5–5.1)
Sodium: 140 mmol/L (ref 135–145)
TOTAL PROTEIN: 8.5 g/dL — AB (ref 6.5–8.1)

## 2017-07-22 LAB — HCG, QUANTITATIVE, PREGNANCY

## 2017-07-22 LAB — ETHANOL: Alcohol, Ethyl (B): 350 mg/dL (ref <5)

## 2017-07-22 MED ORDER — SODIUM CHLORIDE 0.9 % IV BOLUS (SEPSIS)
1000.0000 mL | Freq: Once | INTRAVENOUS | Status: AC
  Administered 2017-07-22: 1000 mL via INTRAVENOUS

## 2017-07-22 MED ORDER — ACETAMINOPHEN 500 MG PO TABS: 1000.0000 mg | ORAL_TABLET | Freq: Once | ORAL | Status: DC

## 2017-07-22 MED ORDER — ONDANSETRON HCL 4 MG/2ML IJ SOLN
4.0000 mg | Freq: Once | INTRAMUSCULAR | Status: AC
  Administered 2017-07-22: 4 mg via INTRAVENOUS

## 2017-07-22 MED ORDER — NALOXONE HCL 2 MG/2ML IJ SOSY
1.0000 mg | PREFILLED_SYRINGE | Freq: Once | INTRAMUSCULAR | Status: AC
  Administered 2017-07-22: 1 mg via INTRAVENOUS

## 2017-07-22 MED ORDER — IBUPROFEN 400 MG PO TABS
400.0000 mg | ORAL_TABLET | Freq: Once | ORAL | Status: AC
  Administered 2017-07-22: 400 mg via ORAL
  Filled 2017-07-22: qty 1

## 2017-07-22 MED ORDER — ONDANSETRON HCL 4 MG/2ML IJ SOLN
INTRAMUSCULAR | Status: AC
  Administered 2017-07-22: 4 mg via INTRAVENOUS
  Filled 2017-07-22: qty 2

## 2017-07-22 NOTE — ED Notes (Signed)
Mother left to go pick up pts children.

## 2017-07-22 NOTE — ED Notes (Signed)
Pt ambulatory with steady gait. Pt up to commode to void.

## 2017-07-22 NOTE — ED Notes (Signed)
pts mother at bedside.

## 2017-07-22 NOTE — ED Notes (Signed)
MD Don PerkingVeronese aware of BP, Orders rcvd

## 2017-07-22 NOTE — ED Provider Notes (Signed)
-----------------------------------------   7:23 PM on 07/22/2017 -----------------------------------------  Patient is awake alert oriented. Patient has eaten and drinking without any difficulty has been ambulatory without difficulty. We will discharge the patient home at this time. A friend is here with the patient will be taking her back home to her mother's house. The mother does not currently have transportation to come pick up the patient.   Minna AntisPaduchowski, Fong Mccarry, MD 07/22/17 1924

## 2017-07-22 NOTE — Discharge Instructions (Signed)
You have been seen in the Emergency Department (ED) today for alcohol, cocaine, marijuana, and amphetamine intoxication.  We almost had to intubate you as you were so intoxicated. You could have died or got seriously hurt. It is okay to have fun but you must drink with moderation and avoid mixing it with drugs.   Do NOT drive today.  Drink plenty of water or other clear liquids (Gatorade, Powerade, etc) for the next 24 hours to help your body recover and stay hydrated.  Follow-up care is a key part of your treatment and safety. Follow up with your doctor in the next 2-5 days. If you need help to stop drinking please call [facility] at [phone]  How can you care for yourself at home?  Be safe with medicines. Take your medicines exactly as prescribed. Call your doctor if you think you are having a problem with your medicine.  Your doctor may have prescribed disulfiram (Antabuse). Do not drink any alcohol while you are taking this medicine. You may have severe or even life-threatening side effects from even small amounts of alcohol.  If you were given medicine to prevent nausea, be sure to take it exactly as prescribed.  Before you take any medicine, tell your doctor if:  You have had a bad reaction to any medicines in the past.  You are taking other medicines, including over-the-counter ones, or have other health problems.  You are or could be pregnant. Be prepared to have some symptoms of withdrawal in the next few days which includes agitation, tremors, anxiety, sweating, headache, nausea, vomiting. Drink plenty of liquids in the next few days to prevent dehydration. Seek help if you need it to stop drinking. Getting counseling and joining a support group can help you stay sober. Try a support group such as Alcoholics Anonymous.  Avoid alcohol when you take medicines. It can react with many medicines and cause serious problems.  When should you call for help?  Call 911 anytime you think you may  need emergency care. For example, call if:  You feel confused and are seeing things that are not there.  You are thinking about killing yourself or hurting others.  You have a seizure.  You vomit blood or what looks like coffee grounds.  Call your doctor now or seek immediate medical care if:  You have trembling, restlessness, sweating, and other withdrawal symptoms that are new or that get worse.  Your withdrawal symptoms come back after not bothering you for days or weeks.  You can't stop vomiting.  Watch closely for changes in your health, and be sure to contact your doctor if:  You need help to stop drinking.

## 2017-07-22 NOTE — ED Notes (Signed)
Pt awake, attempting to secure a ride home.

## 2017-07-22 NOTE — ED Notes (Addendum)
FIRST NURSE NOTE-pt pulled from car completely unresponsive. Brought directly to room 11 via stretcher.  Girl that came in reported they went to party and she was there with her eyes rolled back. They provided name of pt.

## 2017-07-22 NOTE — ED Notes (Signed)
Report from kristin, rn. 

## 2017-07-22 NOTE — ED Provider Notes (Signed)
[facility] Emergency Department Provider Note  ____________________________________________  Time seen: Approximately 9:07 AM  I have reviewed the triage vital signs and the nursing notes.   HISTORY  Chief Complaint unresponsive  Level 5 caveat:  Portions of the history and physical were unable to be obtained due to intoxication   HPI [name] Richards is a 21 y.o. female with no significant PMH who presents unresponsive. Patient dropped off by a friend. According to friend, patient was "partying at her house with all kinds of drugs". Patient opens eyes to her name, moving all extremities, follows some commands, breathing with airway patent. Patient shakes her head when asked if she alcohol, also says yes to drugs but won't tell me which ones.  Past Medical History:  Diagnosis Date  . Herpes   . Medical history non-contributory     Patient Active Problem List   Diagnosis Date Noted  . Postpartum UTI (urinary tract infection) 10/28/2016  . Rupture of membranes with meconium present 10/24/2016  . Labor and delivery, indication for care 10/23/2016  . Asymptomatic bacteriuria 07/12/2016  . Late prenatal care 07/06/2016  . Supervision of normal pregnancy in third trimester 03/03/2015  . Genital herpes affecting pregnancy 03/03/2015    Past Surgical History:  Procedure Laterality Date  . EXCISION VAGINAL CYST      Prior to Admission medications   Medication Sig Start Date End Date Taking? Authorizing Provider  amoxicillin-clavulanate (AUGMENTIN) 500-125 MG tablet Take 1 tablet (500 mg total) by mouth 3 (three) times daily. Patient not taking: Reported on 07/22/2017 10/28/16   Tilda BurrowFerguson, John V, MD  ibuprofen (ADVIL,MOTRIN) 600 MG tablet Take 1 tablet (600 mg total) by mouth every 6 (six) hours. Patient not taking: Reported on 07/22/2017 10/28/16   Tilda BurrowFerguson, John V, MD  Prenatal Multivit-Min-Fe-FA (PRENATAL VITAMINS) 0.8 MG tablet Take 1 tablet by mouth  daily. Patient not taking: Reported on 07/22/2017 05/16/16   Horton, Mayer Maskerourtney F, MD    Allergies Patient has no known allergies.  Family History  Problem Relation Age of Onset  . Asthma Father   . Diabetes Maternal Grandmother   . Diabetes Maternal Grandfather     Social History Social History  Substance Use Topics  . Smoking status: Never Smoker  . Smokeless tobacco: Never Used  . Alcohol use No    Review of Systems Unable to obtain  Level 5 caveat:  Portions of the history and physical were unable to be obtained due to unresponsive  ____________________________________________   PHYSICAL EXAM:  VITAL SIGNS: ED Triage Vitals  Enc Vitals Group     BP 07/22/17 0833 (!) 89/60     Pulse Rate 07/22/17 0833 (!) 118     Resp 07/22/17 0833 (!) 26     Temp 07/22/17 0844 (!) 96.4 F (35.8 C)     Temp Source 07/22/17 0844 Rectal     SpO2 07/22/17 0833 99 %     Weight 07/22/17 0833 100 lb (45.4 kg)     Height --      Head Circumference --      Peak Flow --      Pain Score --      Pain Loc --      Pain Edu? --      Excl. in GC? --     Constitutional: Intoxicated, opens eyes to her name, moving all extremities and follows some commands, non verbal HEENT:      Head: Normocephalic and atraumatic.  Eyes: Conjunctivae are normal. Sclera is non-icteric. Pupils 4mm bilateral and reactive      Mouth/Throat: Mucous membranes are moist.       Neck: Supple with no signs of meningismus. Cardiovascular: Tachycardic with regular rate. No murmurs, gallops, or rubs. 2+ symmetrical distal pulses are present in all extremities. No JVD. Respiratory: Normal respiratory effort. Lungs are clear to auscultation bilaterally. No wheezes, crackles, or rhonchi.  Gastrointestinal: Soft, non distended with positive bowel sounds. No rebound or guarding. Musculoskeletal: No edema, cyanosis, or erythema of extremities. Neurologic: GCS 9 Skin: Skin is warm, dry and intact. No rash  noted.  ____________________________________________   LABS (all labs ordered are listed, but only abnormal results are displayed)  Labs Reviewed  COMPREHENSIVE METABOLIC PANEL - Abnormal; Notable for the following:       Result Value   Potassium 3.2 (*)    Glucose, Bld 138 (*)    Total Protein 8.5 (*)    All other components within normal limits  ETHANOL - Abnormal; Notable for the following:    Alcohol, Ethyl (B) 350 (*)    All other components within normal limits  URINE DRUG SCREEN, QUALITATIVE ([facility] ONLY) - Abnormal; Notable for the following:    Amphetamines, Ur Screen POSITIVE (*)    Cocaine Metabolite,Ur [place] POSITIVE (*)    Cannabinoid 50 Ng, Ur [place] POSITIVE (*)    All other components within normal limits  BLOOD GAS, VENOUS - Abnormal; Notable for the following:    Acid-base deficit 3.2 (*)    All other components within normal limits  CBC  HCG, QUANTITATIVE, PREGNANCY   ____________________________________________  EKG  ED ECG REPORT I, Nita Sickle, the attending physician, personally viewed and interpreted this ECG.  Sinus tachycardia, rate of 135, normal intervals, normal axis, no ST elevations or depressions.  ____________________________________________  RADIOLOGY  none  ____________________________________________   PROCEDURES  Procedure(s) performed: None Procedures Critical Care performed: yes  CRITICAL CARE Performed by: Nita Sickle  ?  Total critical care time: 40 min  Critical care time was exclusive of separately billable procedures and treating other patients.  Critical care was necessary to treat or prevent imminent or life-threatening deterioration.  Critical care was time spent personally by me on the following activities: development of treatment plan with patient and/or surrogate as well as nursing, discussions with consultants, evaluation of patient's response to treatment, examination of patient, obtaining history  from patient or surrogate, ordering and performing treatments and interventions, ordering and review of laboratory studies, ordering and review of radiographic studies, pulse oximetry and re-evaluation of patient's condition.  ____________________________________________   INITIAL IMPRESSION / ASSESSMENT AND PLAN / ED COURSE  21 y.o. female with no significant PMH who presents unresponsive from a party where she was seen doing drugs and drinking alcohol. GCS 9, breathing, normal end-tidal, pupils 4mm and reactive, no signs of trauma. Narcan 1mg  x 2 given with no changes in mental status. Patient started on IVF for tachycardia. Labs and drug screen and hCG pending. Patient being monitored closely for signs of worsening sedation which may require intubation.   ED COURSE:  Patient monitored closely with end tidal CO2. Level of alertness improved during my shift. Patient is still clinically intoxicated but alert and conversing. She does endorse alcohol and drugs last night. Her mother has been at the bedside to see the patient but right now there is no responsible sober adult in the room with her. Her alcohol level was 350 on arrival. Her drug  screen is positive for amphetamine, cocaine and marijuana. Patient received a total 4 L of IV fluid for hypotension and currently has normal vital signs. We'll continue to monitor and until there is a sober adult at the bedside. Had a serious discussion with patient about the dangers of drinking this much and mixing with other drugs including respiratory failure and death. Recommended that she seeks help with AA. Care transferred to Dr. Lenard LancePaduchowski     Pertinent labs & imaging results that were available during my care of the patient were reviewed by me and considered in my medical decision making (see chart for details).    ____________________________________________   FINAL CLINICAL IMPRESSION(S) / ED DIAGNOSES  Final diagnoses:  Polysubstance abuse   Alcohol intoxication with blood level over 0.3  Unresponsive episode      NEW MEDICATIONS STARTED DURING THIS VISIT:  New Prescriptions   No medications on file     Note:  This document was prepared using Dragon voice recognition software and may include unintentional dictation errors.    Nita SickleVeronese, [place], MD 07/23/17 2053

## 2017-07-22 NOTE — ED Triage Notes (Signed)
Pt came to ED, dropped off by group of "friends." Pt unresponsive, thought to be out partying all night. Hr 118, bp 89/60.

## 2017-07-24 LAB — BLOOD GAS, VENOUS
ACID-BASE DEFICIT: 3.2 mmol/L — AB (ref 0.0–2.0)
Bicarbonate: 23.6 mmol/L (ref 20.0–28.0)
O2 SAT: 65.3 %
Patient temperature: 37
pCO2, Ven: 48 mmHg (ref 44.0–60.0)
pH, Ven: 7.3 (ref 7.250–7.430)
pO2, Ven: 38 mmHg (ref 32.0–45.0)

## 2018-03-16 ENCOUNTER — Observation Stay
Admission: EM | Admit: 2018-03-16 | Discharge: 2018-03-16 | Disposition: A | Discharge status: Home / Self Care | Payer: Self-pay | Attending: Obstetrics & Gynecology | Admitting: Obstetrics & Gynecology

## 2018-03-16 DIAGNOSIS — R109 Unspecified abdominal pain: Secondary | ICD-10-CM

## 2018-03-16 DIAGNOSIS — R1031 Right lower quadrant pain: Secondary | ICD-10-CM

## 2018-03-16 DIAGNOSIS — O26899 Other specified pregnancy related conditions, unspecified trimester: Secondary | ICD-10-CM | POA: Diagnosis present

## 2018-03-16 DIAGNOSIS — Z3A21 21 weeks gestation of pregnancy: Secondary | ICD-10-CM | POA: Insufficient documentation

## 2018-03-16 DIAGNOSIS — O26892 Other specified pregnancy related conditions, second trimester: Secondary | ICD-10-CM

## 2018-03-16 LAB — URINE DRUG SCREEN, QUALITATIVE (ARMC ONLY)
AMPHETAMINES, UR SCREEN: NOT DETECTED
Barbiturates, Ur Screen: NOT DETECTED
Benzodiazepine, Ur Scrn: NOT DETECTED
Cannabinoid 50 Ng, Ur ~~LOC~~: NOT DETECTED
Cocaine Metabolite,Ur ~~LOC~~: NOT DETECTED
MDMA (ECSTASY) UR SCREEN: NOT DETECTED
Methadone Scn, Ur: NOT DETECTED
Opiate, Ur Screen: NOT DETECTED
Phencyclidine (PCP) Ur S: NOT DETECTED
TRICYCLIC, UR SCREEN: NOT DETECTED

## 2018-03-16 LAB — URINALYSIS, ROUTINE W REFLEX MICROSCOPIC
Bacteria, UA: NONE SEEN
Bilirubin Urine: NEGATIVE
Glucose, UA: NEGATIVE mg/dL
Ketones, ur: NEGATIVE mg/dL
NITRITE: NEGATIVE
PROTEIN: NEGATIVE mg/dL
SPECIFIC GRAVITY, URINE: 1.018 (ref 1.005–1.030)
pH: 7 (ref 5.0–8.0)

## 2018-03-16 LAB — WET PREP, GENITAL
Clue Cells Wet Prep HPF POC: NONE SEEN
Sperm: NONE SEEN
Trich, Wet Prep: NONE SEEN
YEAST WET PREP: NONE SEEN

## 2018-03-16 LAB — CHLAMYDIA/NGC RT PCR (ARMC ONLY)
Chlamydia Tr: NOT DETECTED
N GONORRHOEAE: NOT DETECTED

## 2018-03-16 MED ORDER — ACETAMINOPHEN 325 MG PO TABS: 650.0000 mg | ORAL_TABLET | Freq: Four times a day (QID) | ORAL | Status: AC | PRN

## 2018-03-16 MED ORDER — ONDANSETRON HCL 4 MG/2ML IJ SOLN: 4.0000 mg | Freq: Four times a day (QID) | INTRAMUSCULAR | Status: DC | PRN

## 2018-03-16 MED ORDER — ACETAMINOPHEN 325 MG PO TABS
650.0000 mg | ORAL_TABLET | ORAL | Status: DC | PRN
  Administered 2018-03-16: 650 mg via ORAL
  Filled 2018-03-16: qty 2

## 2018-03-16 NOTE — H&P (Signed)
OB History & Physical   History of Present Illness:  Chief Complaint: abdominal pain  HPI:  [name] Richards is a 22 y.o. [id number] female at 4189w0d dated by a 3573w6d ultrasound [facility].  Her pregnancy has been complicated by no prenatal care.  She has a history of multiple substance use, but denies any this pregnancy..    She denies contractions.   She denies leakage of fluid.   She denies vaginal bleeding.   She reports fetal movement.    She presents with 6 days of suprapubic abdominal pain that is described as being distributed in a band-like fashion across her abdomen.  It does not radiate. She has a hard time describing the pain. She states it as intense and always present. Different positions make it worse. Opening her legs makes it feel better.  She denies any associated symptoms. Specifically, she denies fevers, chills, nausea, vomiting, diarrhea, constipation, urinary symptoms, back pain, vaginal symptoms.    She states that [facility] [place] to consider [facility]. On 2/1 an ultrasound showed her to be 7573w6d gestation. So, she decided no to terminate.  She has had essentially no other prenatal care. She has an appointment on Monday at some "clinic."  However, the way [facility]    Notably, she was seen in the ER last fall for what sounds like an accidental overdose. Her toxicology screen was positive for amphetamines, cocaine, and marijuana, and she had a very high alcohol level. She states she had never tried any of those drugs before and has not used them since.  Maternal Medical History:   Past Medical History:  Diagnosis Date  . Herpes   . Medical history non-contributory     Past Surgical History:  Procedure Laterality Date  . EXCISION VAGINAL CYST     Allergies: No Known Allergies  Prior to Admission medications   Medication Sig Start Date End Date Taking? Authorizing Provider   Prenatal Multivit-Min-Fe-FA (PRENATAL VITAMINS) 0.8 MG tablet Take 1 tablet by mouth daily. 05/16/16   Horton, Mayer Maskerourtney F, MD    OB History  Gravida Para Term Preterm AB Living  3 2 2     2   SAB TAB Ectopic Multiple Live Births        0 2    # Outcome Date GA Lbr Len/2nd Weight Sex Delivery Anes PTL Lv  3 Current           2 Term 10/24/16 4953w0d   F Vag-Spont   LIV  1 Term 09/04/15 2414w3d 12:45 / 00:14 6 lb 10.4 oz (3.015 kg) M Vag-Spont EPI  LIV    Prenatal care site: None  Social History: She  reports that she has never smoked. She has never used smokeless tobacco. She reports that she does not drink alcohol or use drugs.  Notably, she was seen in the ER last fall for what sounds like an accidental overdose. Her toxicology screen was positive for amphetamines, cocaine, and marijuana, and she had a very high alcohol level. She states she had never tried any of those drugs before and has not used them since.  Family History: family history includes Asthma in her father; Diabetes in her maternal grandfather and maternal grandmother.   Review of Systems:  Review of Systems  Constitutional: Negative.  Negative for chills, fever, malaise/fatigue and weight loss.  HENT: Negative.   Eyes: Negative.   Respiratory: Negative.  Cardiovascular: Negative.   Gastrointestinal: Positive for abdominal pain (see HPI). Negative for blood in stool, constipation, diarrhea, heartburn, melena, nausea and vomiting.  Genitourinary: Negative.   Musculoskeletal: Negative.   Skin: Negative.  Negative for itching and rash.  Neurological: Negative.   Psychiatric/Behavioral: Negative.        Physical Exam:  Vital Signs: BP (!) 97/53   Pulse 88   Temp 98.2 F (36.8 C) (Oral)   Resp 16  Constitutional: Well nourished, well developed female in no acute distress.  HEENT: normal Skin: Warm and dry.  Cardiovascular: Regular rate and rhythm.   Extremity: no edema  Respiratory: Clear to auscultation  bilateral. Normal respiratory effort Abdomen: FHT present and gravid, TTP in her lower abdomen diffusely. No rebound or guarding. Back: no CVAT Neuro: DTRs 2+, Cranial nerves grossly intact Psych: Alert and Oriented x3. No memory deficits. Normal mood and affect.  MS: normal gait, normal bilateral lower extremity ROM/strength/stability.  Pelvic exam: (female chaperone present) is not limited by body habitus EGBUS: within normal limits Vagina: within normal limits and with normal mucosa blood in the vault Cervix: closed/thick/high   Pertinent Results:  Prenatal Labs: Blood type/Rh O positive   Baseline FHR: Positive in normal range Toco: no activity  Lab Results  Component Value Date   APPEARANCEUR HAZY (A) 03/16/2018   GLUCOSEU NEGATIVE 03/16/2018   BILIRUBINUR NEGATIVE 03/16/2018   KETONESUR NEGATIVE 03/16/2018   LABSPEC 1.018 03/16/2018   HGBUR MODERATE (A) 03/16/2018   PHURINE 7.0 03/16/2018   NITRITE NEGATIVE 03/16/2018   LEUKOCYTESUR TRACE (A) 03/16/2018   RBCU 6-30 03/16/2018   WBCU 0-5 03/16/2018   BACTERIA NONE SEEN 03/16/2018   EPIU 6-30 (A) 03/16/2018   MUCOUSUACOMP PRESENT 10/13/2013     Assessment:  [name] Richards is a 22 y.o. G38P2002 female at [redacted]w[redacted]d with abdominal pain of unknown cause.   Plan:  1. Admitted to labor and delivery triage. The patient wanted to leave prior to being fully evaluated. I discussed that I did not complete my evaluation and that I would like to get the results from a wet prep, gonorrhea/chlamydia test, perform an ultrasound to assess the fetus. She voiced understanding. However, she states that due to social demands (taking care of her children) she has to leave.  She will come back if her pain symptoms do not abate or improve. She does not have obvious signs of preterm labor. Though, I was unable to complete my evaluation and fully assess her for other causes to her pain that could be dangerous to her. She voiced understanding and still  would like discharge. She is hemodynamically stable and will be discharged with strong recommendation to have a low threshold to return to the hospital for a full workup.   Thomasene Mohair, MD 03/16/2018 12:16 PM

## 2018-03-16 NOTE — OB Triage Note (Signed)
Patient with complaint of lower abdominal pain.  Pelvic exam performed.  Labs sent and results pending.  Patient states that she has an appointment on Monday at the [facility].  Best phone number for reaching patient is [phone].

## 2018-03-16 NOTE — Discharge Instructions (Signed)
Keep or schedule visit with provider.  If you have questions you may call the on call provider.  You may also call the nurse's desk at [facility] at [phone].  If you have emergent concerns please go to the nearest emergency department for evaluation.  If you have bleeding or leaking of fluid you need to come in for evaluation.

## 2018-03-16 NOTE — Discharge Summary (Signed)
Physician Final Progress Note  Patient ID: [name] AmenDeysi Richards MRN: [id number] DOB/AGE: 23-17-1997 23 y.o.  Admit date: 03/16/2018 Admitting provider: Nadara Mustardobert P Harris, MD Discharge date: 03/16/2018   Admission Diagnoses:  1) intrauterine pregnancy at 8564w0d  2) lower abdominal, suprapubic abdominal pain in pregnancy  Discharge Diagnoses:   1) intrauterine pregnancy at 3664w0d  2) lower abdominal, suprapubic abdominal pain in pregnancy  History of Present Illness: [name] Richards is a 22 y.o. 673P2002 female at 5964w0d dated by a 3860w6d ultrasound [facility].  Her pregnancy has been complicated by no prenatal care.  She has a history of multiple substance use, but denies any this pregnancy..    She denies contractions.   She denies leakage of fluid.   She denies vaginal bleeding.   She reports fetal movement.    She presents with 6 days of suprapubic abdominal pain that is described as being distributed in a band-like fashion across her abdomen.  It does not radiate. She has a hard time describing the pain. She states it as intense and always present. Different positions make it worse. Opening her legs makes it feel better.  She denies any associated symptoms. Specifically, she denies fevers, chills, nausea, vomiting, diarrhea, constipation, urinary symptoms, back pain, vaginal symptoms.    She states that [facility] [place] to consider termination. On 2/1 an ultrasound showed her to be 8160w6d gestation. So, she decided no to terminate.  She has had essentially no other prenatal care. She has an appointment on Monday at some "clinic."  However, the way she describes [facility]    Notably, she was seen in the ER last fall for what sounds like an accidental overdose. Her toxicology screen was positive for amphetamines, cocaine, and marijuana, and she had a very high alcohol level. She states she had never tried any of those drugs  before and has not used them since.  Hospital Course: the patient was admitted to observation. She had normal fetal heart tones.  Her uterus appeared to be roughly the appropriate size for gestational age. She had a UA that was not strongly suggestive of cystitis.  She did have testing for vaginal infections and STIs. However, she left prior to completion of her assessment.  All vitals and monitoring were otherwise reassuring.  She was discharged at her request and given strict instructions to return to the hospital, if her symptoms do not improve or if they worsen.  Past Medical History:  Diagnosis Date  . Herpes   . Medical history non-contributory     Past Surgical History:  Procedure Laterality Date  . EXCISION VAGINAL CYST      No current facility-administered medications on file prior to encounter.    Current Outpatient Medications on File Prior to Encounter  Medication Sig Dispense Refill  . amoxicillin-clavulanate (AUGMENTIN) 500-125 MG tablet Take 1 tablet (500 mg total) by mouth 3 (three) times daily. (Patient not taking: Reported on 07/22/2017) 15 tablet 0  . ibuprofen (ADVIL,MOTRIN) 600 MG tablet Take 1 tablet (600 mg total) by mouth every 6 (six) hours. (Patient not taking: Reported on 07/22/2017) 30 tablet 0  . Prenatal Multivit-Min-Fe-FA (PRENATAL VITAMINS) 0.8 MG tablet Take 1 tablet by mouth daily. 30 tablet 12    No Known Allergies  Social History   Socioeconomic History  . Marital status: Single    Spouse name: Not on file  . Number of children: Not on  file  . Years of education: Not on file  . Highest education level: Not on file  Occupational History  . Not on file  Social Needs  . Financial resource strain: Not on file  . Food insecurity:    Worry: Not on file    Inability: Not on file  . Transportation needs:    Medical: Not on file    Non-medical: Not on file  Tobacco Use  . Smoking status: Never Smoker  . Smokeless tobacco: Never Used  Substance  and Sexual Activity  . Alcohol use: No  . Drug use: No  . Sexual activity: Yes    Birth control/protection: None    Comment: Not in the 24 hours  Lifestyle  . Physical activity:    Days per week: Not on file    Minutes per session: Not on file  . Stress: Not on file  Relationships  . Social connections:    Talks on phone: Not on file    Gets together: Not on file    Attends religious service: Not on file    Active member of club or organization: Not on file    Attends meetings of clubs or organizations: Not on file    Relationship status: Not on file  . Intimate partner violence:    Fear of current or ex partner: Not on file    Emotionally abused: Not on file    Physically abused: Not on file    Forced sexual activity: Not on file  Other Topics Concern  . Not on file  Social History Narrative  . Not on file    Physical Exam: BP (!) 97/53   Pulse 88   Temp 98.2 F (36.8 C) (Oral)   Resp 16   Physical Exam  Constitutional: She is oriented to person, place, and time. She appears well-developed and well-nourished. No distress.  HENT:  Head: Normocephalic and atraumatic.  Neck: Normal range of motion. Neck supple. No thyromegaly present.  Cardiovascular: Normal rate and regular rhythm.  Pulmonary/Chest: Effort normal and breath sounds normal. No respiratory distress.  Abdominal: Soft. Bowel sounds are normal. There is tenderness. There is no rebound and no guarding.  Gravid, ttp in lower abdomen. No rebound or guarding  Genitourinary: Vagina normal. Pelvic exam was performed with patient supine. There is no rash, tenderness or lesion on the right labia. There is no rash, tenderness or lesion on the left labia. Cervix exhibits no motion tenderness and no discharge. No erythema, tenderness or bleeding in the vagina. No foreign body in the vagina. No signs of injury around the vagina. No vaginal discharge found.  Genitourinary Comments: Cervix: closed/thick/high   Musculoskeletal: Normal range of motion. She exhibits no edema.  Neurological: She is alert and oriented to person, place, and time. A cranial nerve deficit is present.  Skin: Skin is warm and dry. No erythema.  Psychiatric: She has a normal mood and affect. Her behavior is normal. Judgment normal.   Female chaperone present for pelvic exam:   Consults: None  Significant Findings/ Diagnostic Studies:  Lab Results  Component Value Date   APPEARANCEUR HAZY (A) 03/16/2018   GLUCOSEU NEGATIVE 03/16/2018   BILIRUBINUR NEGATIVE 03/16/2018   KETONESUR NEGATIVE 03/16/2018   LABSPEC 1.018 03/16/2018   HGBUR MODERATE (A) 03/16/2018   PHURINE 7.0 03/16/2018   NITRITE NEGATIVE 03/16/2018   LEUKOCYTESUR TRACE (A) 03/16/2018   RBCU 6-30 03/16/2018   WBCU 0-5 03/16/2018   BACTERIA NONE SEEN 03/16/2018   EPIU  6-30 (A) 03/16/2018   MUCOUSUACOMP PRESENT 10/13/2013    UDS negative  Procedures: fetal heart tones normal  Discharge Condition: stable  Disposition: Discharge disposition: 01-Home or Self Care       Diet: Regular diet  Discharge Activity: Activity as tolerated   Allergies as of 03/16/2018   No Known Allergies     Medication List    STOP taking these medications   amoxicillin-clavulanate 500-125 MG tablet Commonly known as:  AUGMENTIN   ibuprofen 600 MG tablet Commonly known as:  ADVIL,MOTRIN     TAKE these medications   acetaminophen 325 MG tablet Commonly known as:  TYLENOL Take 2 tablets (650 mg total) by mouth every 6 (six) hours as needed.   Prenatal Vitamins 0.8 MG tablet Take 1 tablet by mouth daily.        Total time spent taking care of this patient: 30 minutes  Signed: Thomasene Mohair, MD  03/16/2018, 12:30 PM

## 2018-03-16 NOTE — OB Triage Note (Signed)
Patient came in complaining of lower abdominal pain for the past week. However, since 2am today the pain woke her up from her sleep, stating the pain is 7 out of 10 and constant. Abdomen in tender to touch. Patient states "lots of movement to the point that it is uncomfortable with any position". States increase in urination. No vaginal bleeding and no LOF. Positive FM. Fetal doppler HR 147.

## 2018-03-18 LAB — URINE CULTURE: SPECIAL REQUESTS: NORMAL

## 2018-03-19 ENCOUNTER — Telehealth: Payer: Self-pay | Admitting: Obstetrics and Gynecology

## 2018-03-19 NOTE — Telephone Encounter (Signed)
Left generic VM. Will discuss urine culture results when calls back.

## 2018-03-29 NOTE — Telephone Encounter (Signed)
Pt still has not called[facility]

## 2018-10-19 ENCOUNTER — Encounter (HOSPITAL_COMMUNITY): Payer: Self-pay

## 2019-02-16 ENCOUNTER — Emergency Department
Admission: EM | Admit: 2019-02-16 | Discharge: 2019-02-16 | Disposition: A | Discharge status: Home / Self Care | Payer: Self-pay | Attending: Emergency Medicine | Admitting: Emergency Medicine

## 2019-02-16 ENCOUNTER — Other Ambulatory Visit: Payer: Self-pay

## 2019-02-16 DIAGNOSIS — B9689 Other specified bacterial agents as the cause of diseases classified elsewhere: Secondary | ICD-10-CM | POA: Insufficient documentation

## 2019-02-16 DIAGNOSIS — N760 Acute vaginitis: Secondary | ICD-10-CM | POA: Insufficient documentation

## 2019-02-16 LAB — WET PREP, GENITAL
Sperm: NONE SEEN
Trich, Wet Prep: NONE SEEN
Yeast Wet Prep HPF POC: NONE SEEN

## 2019-02-16 LAB — URINALYSIS, COMPLETE (UACMP) WITH MICROSCOPIC
Bilirubin Urine: NEGATIVE
GLUCOSE, UA: NEGATIVE mg/dL
HGB URINE DIPSTICK: NEGATIVE
Ketones, ur: NEGATIVE mg/dL
Leukocytes,Ua: NEGATIVE
NITRITE: NEGATIVE
Protein, ur: NEGATIVE mg/dL
SPECIFIC GRAVITY, URINE: 1.02 (ref 1.005–1.030)
pH: 6 (ref 5.0–8.0)

## 2019-02-16 LAB — COMPREHENSIVE METABOLIC PANEL
ALT: 16 U/L (ref 0–44)
AST: 20 U/L (ref 15–41)
Albumin: 4.4 g/dL (ref 3.5–5.0)
Alkaline Phosphatase: 61 U/L (ref 38–126)
Anion gap: 3 — ABNORMAL LOW (ref 5–15)
BILIRUBIN TOTAL: 0.5 mg/dL (ref 0.3–1.2)
BUN: 15 mg/dL (ref 6–20)
CHLORIDE: 111 mmol/L (ref 98–111)
CO2: 25 mmol/L (ref 22–32)
CREATININE: 0.66 mg/dL (ref 0.44–1.00)
Calcium: 9 mg/dL (ref 8.9–10.3)
Glucose, Bld: 100 mg/dL — ABNORMAL HIGH (ref 70–99)
POTASSIUM: 3.2 mmol/L — AB (ref 3.5–5.1)
Sodium: 139 mmol/L (ref 135–145)
Total Protein: 7.5 g/dL (ref 6.5–8.1)

## 2019-02-16 LAB — LIPASE, BLOOD: LIPASE: 47 U/L (ref 11–51)

## 2019-02-16 LAB — CBC
HEMATOCRIT: 41.7 % (ref 36.0–46.0)
HEMOGLOBIN: 13.8 g/dL (ref 12.0–15.0)
MCH: 30.8 pg (ref 26.0–34.0)
MCHC: 33.1 g/dL (ref 30.0–36.0)
MCV: 93.1 fL (ref 80.0–100.0)
NRBC: 0 % (ref 0.0–0.2)
Platelets: 174 10*3/uL (ref 150–400)
RBC: 4.48 MIL/uL (ref 3.87–5.11)
RDW: 11.7 % (ref 11.5–15.5)
WBC: 6.7 10*3/uL (ref 4.0–10.5)

## 2019-02-16 LAB — CHLAMYDIA/NGC RT PCR (ARMC ONLY)
CHLAMYDIA TR: NOT DETECTED
N GONORRHOEAE: NOT DETECTED

## 2019-02-16 LAB — POCT PREGNANCY, URINE: PREG TEST UR: NEGATIVE

## 2019-02-16 MED ORDER — METRONIDAZOLE 500 MG PO TABS
500.0000 mg | ORAL_TABLET | Freq: Once | ORAL | Status: AC
  Administered 2019-02-16: 500 mg via ORAL
  Filled 2019-02-16: qty 1

## 2019-02-16 MED ORDER — ALUM & MAG HYDROXIDE-SIMETH 200-200-20 MG/5ML PO SUSP
30.0000 mL | Freq: Once | ORAL | Status: AC
  Administered 2019-02-16: 30 mL via ORAL

## 2019-02-16 MED ORDER — METRONIDAZOLE 500 MG PO TABS
500.0000 mg | ORAL_TABLET | Freq: Two times a day (BID) | ORAL | 0 refills | Status: AC
Start: 2019-02-16 — End: 2019-02-23

## 2019-02-16 NOTE — ED Triage Notes (Signed)
Patient reports that she is having abdominal pain and is concerned she might have HIV.

## 2019-02-16 NOTE — ED Notes (Signed)
Pt awake and alert, waiting patiently for treatment room 

## 2019-02-16 NOTE — ED Provider Notes (Signed)
[facility] Emergency Department Provider Note ______   First MD Initiated Contact with Patient 02/16/19 [phone]     (approximate)  I have reviewed the triage vital signs and the nursing notes.   HISTORY  Chief Complaint Abdominal Pain   HPI [name] Richards is a 23 y.o. female presents to the emergency department 2-week history of yellow vaginal discharge, pelvic discomfort.  Patient states that she had unprotected sex with a new sexual partner 1 month ago.  Patient states that she is very concerned about the possibility of acquiring HIV as a result of that encounter.  Patient denies any fever.  Patient denies any rash.  Patient denies any joint pain.    Past Medical History:  Diagnosis Date  . Herpes   . Medical history non-contributory     Patient Active Problem List   Diagnosis Date Noted  . Abdominal pain affecting pregnancy 03/16/2018  . Postpartum UTI (urinary tract infection) 10/28/2016  . Rupture of membranes with meconium present 10/24/2016  . Labor and delivery, indication for care 10/23/2016  . Asymptomatic bacteriuria 07/12/2016  . Late prenatal care 07/06/2016  . Supervision of normal pregnancy in third trimester 03/03/2015  . Genital herpes affecting pregnancy 03/03/2015    Past Surgical History:  Procedure Laterality Date  . EXCISION VAGINAL CYST      Prior to Admission medications   Medication Sig Start Date End Date Taking? Authorizing Provider  acetaminophen (TYLENOL) 325 MG tablet Take 2 tablets (650 mg total) by mouth every 6 (six) hours as needed. 03/16/18  Yes Conard Novak, MD    Allergies Patient has no known allergies.  Family History  Problem Relation Age of Onset  . Asthma Father   . Diabetes Maternal Grandmother   . Diabetes Maternal Grandfather     Social History Social History   Tobacco Use  . Smoking status: Never Smoker  . Smokeless tobacco: Never Used  Substance Use Topics  . Alcohol use: No  .  Drug use: No    Review of Systems Constitutional: No fever/chills Eyes: No visual changes. ENT: No sore throat. Cardiovascular: Denies chest pain. Respiratory: Denies shortness of breath. Gastrointestinal: No abdominal pain.  No nausea, no vomiting.  No diarrhea.  No constipation. Genitourinary: Positive for vaginal discharge and dysuria. Musculoskeletal: Negative for neck pain.  Negative for back pain. Integumentary: Negative for rash. Neurological: Negative for headaches, focal weakness or numbness.   ____________________________________________   PHYSICAL EXAM:  VITAL SIGNS: ED Triage Vitals  Enc Vitals Group     BP 02/16/19 0026 131/83     Pulse Rate 02/16/19 0026 (!) 111     Resp 02/16/19 0026 20     Temp 02/16/19 0026 98.9 F (37.2 C)     Temp Source 02/16/19 0026 Oral     SpO2 02/16/19 0026 100 %     Weight 02/16/19 0022 47.2 kg (104 lb)     Height 02/16/19 0022 1.549 m (5\' 1" )     Head Circumference --      Peak Flow --      Pain Score 02/16/19 0022 10     Pain Loc --      Pain Edu? --      Excl. in GC? --     Constitutional: Alert and oriented. Well appearing and in no acute distress. Eyes: Conjunctivae are normal.  Mouth/Throat: Mucous membranes are moist.  Oropharynx non-erythematous. Neck: No stridor.  Cardiovascular: Normal rate, regular rhythm. Good peripheral circulation.  Grossly normal heart sounds. Respiratory: Normal respiratory effort.  No retractions. Lungs CTAB. Gastrointestinal: Soft and nontender. No distention.  Genitourinary: Positive for yellow vaginal discharge frothy. Musculoskeletal: No lower extremity tenderness nor edema. No gross deformities of extremities. Neurologic:  Normal speech and language. No gross focal neurologic deficits are appreciated.  Skin:  Skin is warm, dry and intact. No rash noted. Psychiatric: Mood and affect are normal. Speech and behavior are normal.  ____________________________________________   LABS (all  labs ordered are listed, but only abnormal results are displayed)  Labs Reviewed  WET PREP, GENITAL - Abnormal; Notable for the following components:      Result Value   Clue Cells Wet Prep HPF POC PRESENT (*)    WBC, Wet Prep HPF POC MANY (*)    All other components within normal limits  COMPREHENSIVE METABOLIC PANEL - Abnormal; Notable for the following components:   Potassium 3.2 (*)    Glucose, Bld 100 (*)    Anion gap 3 (*)    All other components within normal limits  URINALYSIS, COMPLETE (UACMP) WITH MICROSCOPIC - Abnormal; Notable for the following components:   Color, Urine YELLOW (*)    APPearance CLEAR (*)    Bacteria, UA RARE (*)    All other components within normal limits  CHLAMYDIA/NGC RT PCR ([facility] ONLY)  LIPASE, BLOOD  CBC  RPR  HIV ANTIBODY (ROUTINE TESTING W REFLEX)  POC URINE PREG, ED  POCT PREGNANCY, URINE     Procedures   ____________________________________________   INITIAL IMPRESSION / MDM / ASSESSMENT AND PLAN / ED COURSE  As part of my medical decision making, I reviewed the following data within the electronic MEDICAL RECORD NUMBER   23 year old female present with above-stated history and physical exam concerning for sexually transmitted action.  Vaginal exam performed which revealed yellow frothy vaginal discharge.  Wet prep revealed evidence of clue cells and many bacteria.  Gonorrhea and chlamydia pending at this time.  RPR and HIV also obtained and pending.  Patient's care transferred to Dr. Cyril Loosen    ____________________________________________  FINAL CLINICAL IMPRESSION(S) / ED DIAGNOSES  Final diagnoses:  Bacterial vaginosis     MEDICATIONS GIVEN DURING THIS VISIT:  Medications  alum & mag hydroxide-simeth (MAALOX/MYLANTA) 200-200-20 MG/5ML suspension 30 mL (30 mLs Oral Given 02/16/19 0440)  metroNIDAZOLE (FLAGYL) tablet 500 mg (500 mg Oral Given 02/16/19 0604)     ED Discharge Orders    None       Note:  This  document was prepared using Dragon voice recognition software and may include unintentional dictation errors.   Darci Current, MD 02/19/19 [phone]

## 2019-02-18 LAB — RPR: RPR: NONREACTIVE

## 2019-02-18 LAB — HIV ANTIBODY (ROUTINE TESTING W REFLEX): HIV Screen 4th Generation wRfx: NONREACTIVE

## 2019-09-26 ENCOUNTER — Other Ambulatory Visit: Payer: Self-pay

## 2019-09-26 DIAGNOSIS — Z20822 Contact with and (suspected) exposure to covid-19: Secondary | ICD-10-CM

## 2019-09-27 LAB — NOVEL CORONAVIRUS, NAA: SARS-CoV-2, NAA: NOT DETECTED

## 2019-09-29 ENCOUNTER — Telehealth: Payer: Self-pay | Admitting: Hematology

## 2019-09-29 NOTE — Telephone Encounter (Signed)
Pt is aware covid 19 is negative °

## 2023-06-17 ENCOUNTER — Other Ambulatory Visit: Payer: Self-pay

## 2023-06-17 ENCOUNTER — Emergency Department: Payer: Worker's Compensation

## 2023-06-17 ENCOUNTER — Emergency Department
Admission: EM | Admit: 2023-06-17 | Discharge: 2023-06-18 | Disposition: A | Discharge status: Home / Self Care | Payer: Worker's Compensation | Attending: Emergency Medicine | Admitting: Emergency Medicine

## 2023-06-17 DIAGNOSIS — M7989 Other specified soft tissue disorders: Secondary | ICD-10-CM | POA: Diagnosis not present

## 2023-06-17 DIAGNOSIS — S99911A Unspecified injury of right ankle, initial encounter: Secondary | ICD-10-CM | POA: Insufficient documentation

## 2023-06-17 DIAGNOSIS — Y990 Civilian activity done for income or pay: Secondary | ICD-10-CM | POA: Insufficient documentation

## 2023-06-17 DIAGNOSIS — W228XXA Striking against or struck by other objects, initial encounter: Secondary | ICD-10-CM | POA: Insufficient documentation

## 2023-06-17 MED ORDER — IBUPROFEN 600 MG PO TABS
600.0000 mg | ORAL_TABLET | Freq: Once | ORAL | Status: AC
  Administered 2023-06-17: 600 mg via ORAL
  Filled 2023-06-17: qty 1

## 2023-06-17 NOTE — ED Provider Notes (Signed)
   [facility] Provider Note    Event Date/Time   First MD Initiated Contact with Patient 06/17/23 2344     (approximate)   History   Ankle Pain   HPI  [name] Richards is a 27 y.o. female   Past medical history of no significant past medical history presents to the emergency department with right ankle injury at work.  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  Able to ambulate.  No other injuries.      Physical Exam   Triage Vital Signs: ED Triage Vitals  Enc Vitals Group     BP 06/17/23 2220 120/80     Pulse Rate 06/17/23 2220 80     Resp 06/17/23 2220 17     Temp 06/17/23 2220 97.6 F (36.4 C)     Temp Source 06/17/23 2220 Oral     SpO2 06/17/23 2220 99 %     Weight 06/17/23 2222 135 lb (61.2 kg)     Height 06/17/23 2222 5\' 1"  (1.549 m)     Head Circumference --      Peak Flow --      Pain Score 06/17/23 2222 8     Pain Loc --      Pain Edu? --      Excl. in GC? --     Most recent vital signs: Vitals:   06/17/23 2220  BP: 120/80  Pulse: 80  Resp: 17  Temp: 97.6 F (36.4 C)  SpO2: 99%    General: Awake, no distress.  CV:  Good peripheral perfusion.  Resp:  Normal effort.  Abd:  No distention.  Other:  No significant tenderness deformity to the affected lateral ankle site of injury.  Neurovascular intact.   ED Results / Procedures / Treatments   Labs (all labs ordered are listed, but only abnormal results are displayed) Labs Reviewed - No data to display RADIOLOGY I independently reviewed and interpreted ankle x-ray and I see no obvious fracture or dislocation   PROCEDURES:  Critical Care performed: No  Procedures   MEDICATIONS ORDERED IN ED: Medications  ibuprofen (ADVIL) tablet 600 mg (600 mg Oral Given 06/17/23 2352)    IMPRESSION / MDM / ASSESSMENT AND PLAN / ED COURSE  I reviewed the triage vital signs and the nursing notes.                                Patient's  presentation is most consistent with acute complicated illness / injury requiring diagnostic workup.  Differential diagnosis includes, but is not limited to, ankle fracture dislocation   The patient is on the cardiac monitor to evaluate for evidence of arrhythmia and/or significant heart rate changes.  MDM: Minor injury benign exam and negative x-ray, patient ambulatory.  Discharged with anticipatory guidance, rice/pain control        FINAL CLINICAL IMPRESSION(S) / ED DIAGNOSES   Final diagnoses:  Right ankle injury, initial encounter     Rx / DC Orders   ED Discharge Orders     None        Note:  This document was prepared using Dragon voice recognition software and may include unintentional dictation errors.    Pilar Jarvis, MD 06/17/23 [phone]

## 2023-06-17 NOTE — Discharge Instructions (Signed)
Take acetaminophen 650 mg and ibuprofen 400 mg every 6 hours for pain.  Take with food.  

## 2023-06-17 NOTE — ED Triage Notes (Addendum)
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. No deformity or swelling noted. PMS intact.

## 2023-06-17 NOTE — ED Notes (Signed)
Pt was given consent form and workers comp. Form. Both forms filled out and test was preformed. Specimen walked down to the lab by Clinical research associate

## 2023-06-18 NOTE — ED Notes (Signed)
Ice pack applied to right ankle.  Instructions given to patient on how to apply ice pack.
